# Patient Record
Sex: Female | Born: 1980 | Race: Black or African American | Hispanic: No | Marital: Married | Smoking: Never smoker
Health system: Southern US, Community
[De-identification: ages and names within clinical notes are randomized; demographics above are authoritative.]

---

## 2016-05-07 HISTORY — PX: TOOTH EXTRACTION: SUR596

## 2016-11-10 DIAGNOSIS — Z6841 Body Mass Index (BMI) 40.0 and over, adult: Secondary | ICD-10-CM

## 2016-11-10 DIAGNOSIS — J302 Other seasonal allergic rhinitis: Secondary | ICD-10-CM | POA: Insufficient documentation

## 2018-04-06 ENCOUNTER — Ambulatory Visit (INDEPENDENT_AMBULATORY_CARE_PROVIDER_SITE_OTHER): Payer: 59 | Admitting: Family Medicine

## 2018-04-06 ENCOUNTER — Ambulatory Visit (INDEPENDENT_AMBULATORY_CARE_PROVIDER_SITE_OTHER): Payer: Self-pay

## 2018-04-06 ENCOUNTER — Encounter (INDEPENDENT_AMBULATORY_CARE_PROVIDER_SITE_OTHER): Payer: Self-pay | Admitting: Family Medicine

## 2018-04-06 DIAGNOSIS — J3489 Other specified disorders of nose and nasal sinuses: Secondary | ICD-10-CM | POA: Diagnosis not present

## 2018-04-06 DIAGNOSIS — S060X0A Concussion without loss of consciousness, initial encounter: Secondary | ICD-10-CM

## 2018-04-06 MED ORDER — TRAMADOL HCL 50 MG PO TABS: 50.0000 mg | ORAL_TABLET | Freq: Four times a day (QID) | ORAL | 0 refills | Status: DC | PRN

## 2018-04-06 NOTE — Progress Notes (Signed)
   Office Visit Note   Patient: Tanya Fitzpatrick           Date of Birth: 06-07-1980           MRN: [id number] Visit Date: 04/06/2018 Requested by: No referring provider defined for this encounter. PCP: Devra Dopp, MD  Subjective: Chief Complaint  Patient presents with  . Swollen nasal area - head vs nose impact  . DOI 04/03/18    HPI: She is here with nasal pain.  On November 17, her 19-month-old child accidentally lifted its head hard into her nose.  Severe pain, she "saw stars".  She has had a headache and dizziness since then.  She had a nosebleed initially which was easy to control.  She is able to breathe out of both nostrils but she has a lot of pain and swelling on the bridge of her nose.  No previous problems with her nose.  No history of concussion.                ROS: She is otherwise in good health.  Objective: Vital Signs: There were no vitals taken for this visit.  Physical Exam:  HEENT: She has swelling and slight asymmetry at the bridge of her nose.  Very tender on the right side.  Nasal passages are patent, no evidence of bleeding.  No other tenderness, funduscopic exam is normal and pupils are equal and reactive to light and accommodation.  Imaging: X-rays nasal bones: Bilateral nondisplaced nasal bone fractures.    Assessment & Plan: 1.  3 days status post head contusion with bilateral nasal bone fractures and probable concussion -Ice applications to reduce swelling.  Anticipate 4 to 6 weeks healing time.  If she has another nasal trauma we will repeat x-rays.  Otherwise we will go by clinical healing. -From a concussion standpoint she will stay out of work until next week.  Minimize screen time.  Head CT if develops worsening symptoms.   Follow-Up Instructions: No follow-ups on file.      Procedures: No procedures performed  No notes on file    PMFS History: Patient Active Problem List   Diagnosis Date Noted  . Class 3 severe obesity due to  excess calories without serious comorbidity with body mass index (BMI) of 45.0 to 49.9 in adult (HCC) 11/10/2016  . Seasonal allergies 11/10/2016   History reviewed. No pertinent past medical history.  History reviewed. No pertinent family history.  History reviewed. No pertinent surgical history. Social History   Occupational History  . Not on file  Tobacco Use  . Smoking status: Not on file  Substance and Sexual Activity  . Alcohol use: Not on file  . Drug use: Not on file  . Sexual activity: Not on file

## 2018-05-09 ENCOUNTER — Other Ambulatory Visit (INDEPENDENT_AMBULATORY_CARE_PROVIDER_SITE_OTHER): Payer: Self-pay | Admitting: Family Medicine

## 2018-08-05 ENCOUNTER — Telehealth (INDEPENDENT_AMBULATORY_CARE_PROVIDER_SITE_OTHER): Payer: Self-pay

## 2018-08-05 NOTE — Telephone Encounter (Signed)
I called the patient, asking her covid-19 screening questions prior to her appointment with Dr. Prince Rome on 08/08/2018. She answered "no" to all.

## 2018-08-08 ENCOUNTER — Other Ambulatory Visit: Payer: Self-pay

## 2018-08-08 ENCOUNTER — Encounter (INDEPENDENT_AMBULATORY_CARE_PROVIDER_SITE_OTHER): Payer: Self-pay | Admitting: Family Medicine

## 2018-08-08 ENCOUNTER — Ambulatory Visit (INDEPENDENT_AMBULATORY_CARE_PROVIDER_SITE_OTHER): Payer: 59 | Admitting: Family Medicine

## 2018-08-08 DIAGNOSIS — R200 Anesthesia of skin: Secondary | ICD-10-CM

## 2018-08-08 NOTE — Progress Notes (Signed)
Office Visit Note   Patient: Tanya Fitzpatrick           Date of Birth: May 30, 1980           MRN: [id number] Visit Date: 08/08/2018 Requested by: Devra Dopp, MD [address] [place], [place] [address] PCP: Devra Dopp, MD  Subjective: Chief Complaint  Patient presents with  . Numbness in the right side of torso x 9 days    HPI: She is here with right arm and torso numbness.  Symptoms started about 8 or 9 days ago, she first noticed numbness in her right hand in the median nerve distribution.  The numbness seemed to spread up her arm and it did not go away.  She then noticed numbness on the right side of her neck, and then on the right side of her thoracic area and torso including her right breast.  Denies any pain with this.  She has dropped some objects with her right hand mainly because of the numbness, not definitely because of any weakness.  She had a headache 3 days ago but nothing severe.  She has had more headaches than usual since her head trauma several months ago.  Denies any blurry vision, double vision, anosmia, change in taste.  No fevers or chills.              ROS:  All other systems were reviewed and are negative.  Objective: Vital Signs: There were no vitals taken for this visit.  Physical Exam:  General:  Alert and oriented, in no acute distress. Pulm:  Breathing unlabored. Psy:  Normal mood, congruent affect. Skin: No rash on her skin. HEENT:  [place]/AT, PERRLA, EOM Full, no nystagmus.  Funduscopic examination within normal limits.  No conjunctival erythema.  Tympanic membranes are pearly gray with normal landmarks.  External ear canals are normal.  Nasal passages are clear.  Oropharynx is clear.  No significant lymphadenopathy.  No thyromegaly or nodules.  2+ carotid pulses without bruits. Cranial nerves II through XII are intact.  Upper extremity strength and reflexes are normal. Right arm: Negative Tinel's at the carpal tunnel but positive  Phalen's test increasing the numbness in her right hand and arm. Other neuro: Light touch sensation is diminished on the right side of her back throughout.  Imaging: None today.  Assessment & Plan: 1.  Right arm and side numbness, etiology uncertain.  Tunnel syndrome could explain her arm symptoms but not her torso symptoms.  Brain injury would be a consideration, less likely would be spinal cord lesion. -We will try a carpal tunnel night splint. -We will go ahead and order brain MRI scan.  If symptoms persist and MRI is unrevealing, then neurology consult.     Procedures: No procedures performed  No notes on file     PMFS History: Patient Active Problem List   Diagnosis Date Noted  . Class 3 severe obesity due to excess calories without serious comorbidity with body mass index (BMI) of 45.0 to 49.9 in adult (HCC) 11/10/2016  . Seasonal allergies 11/10/2016   History reviewed. No pertinent past medical history.  History reviewed. No pertinent family history.  History reviewed. No pertinent surgical history. Social History   Occupational History  . Not on file  Tobacco Use  . Smoking status: Not on file  Substance and Sexual Activity  . Alcohol use: Not on file  . Drug use: Not on file  . Sexual activity: Not on file

## 2018-08-11 ENCOUNTER — Telehealth (INDEPENDENT_AMBULATORY_CARE_PROVIDER_SITE_OTHER): Payer: Self-pay | Admitting: Family Medicine

## 2018-08-11 MED ORDER — TIZANIDINE HCL 2 MG PO TABS: 2.0000 mg | ORAL_TABLET | Freq: Four times a day (QID) | ORAL | 1 refills | Status: DC | PRN

## 2018-08-11 NOTE — Telephone Encounter (Signed)
I called and advised the patient. 

## 2018-08-11 NOTE — Telephone Encounter (Signed)
Rx sent 

## 2018-08-11 NOTE — Telephone Encounter (Signed)
Patient called and stated needs something for muscle spasms.  Please call patient to advise.  [phone]

## 2018-08-11 NOTE — Telephone Encounter (Signed)
Please advise 

## 2018-10-04 ENCOUNTER — Other Ambulatory Visit: Payer: Self-pay

## 2018-10-17 ENCOUNTER — Other Ambulatory Visit: Payer: 59

## 2019-05-24 ENCOUNTER — Ambulatory Visit
Admission: RE | Admit: 2019-05-24 | Discharge: 2019-05-24 | Disposition: A | Discharge status: Home / Self Care | Payer: 59 | Source: Ambulatory Visit | Attending: Family Medicine | Admitting: Family Medicine

## 2019-05-24 ENCOUNTER — Other Ambulatory Visit: Payer: Self-pay

## 2019-05-24 ENCOUNTER — Other Ambulatory Visit: Payer: 59

## 2019-05-24 DIAGNOSIS — R200 Anesthesia of skin: Secondary | ICD-10-CM

## 2019-05-25 ENCOUNTER — Encounter (INDEPENDENT_AMBULATORY_CARE_PROVIDER_SITE_OTHER): Payer: Self-pay | Admitting: Family Medicine

## 2019-05-25 ENCOUNTER — Telehealth: Payer: Self-pay | Admitting: Family Medicine

## 2019-05-25 DIAGNOSIS — G35 Multiple sclerosis: Secondary | ICD-10-CM

## 2019-05-25 NOTE — Telephone Encounter (Signed)
Brain MRI scan shows multiple bilateral supratentorial white matter lesions concerning for multiple sclerosis.  I will discuss these findings with the patient and make referral to neurologist.

## 2019-06-21 ENCOUNTER — Ambulatory Visit (INDEPENDENT_AMBULATORY_CARE_PROVIDER_SITE_OTHER): Payer: No Typology Code available for payment source | Admitting: Neurology

## 2019-06-21 ENCOUNTER — Other Ambulatory Visit: Payer: Self-pay

## 2019-06-21 ENCOUNTER — Telehealth: Payer: Self-pay | Admitting: *Deleted

## 2019-06-21 ENCOUNTER — Encounter: Payer: Self-pay | Admitting: Neurology

## 2019-06-21 VITALS — BP 118/78 | HR 76 | Temp 97.1°F | Ht 67.0 in | Wt 279.4 lb

## 2019-06-21 DIAGNOSIS — G35 Multiple sclerosis: Secondary | ICD-10-CM

## 2019-06-21 DIAGNOSIS — R269 Unspecified abnormalities of gait and mobility: Secondary | ICD-10-CM | POA: Insufficient documentation

## 2019-06-21 DIAGNOSIS — Z79899 Other long term (current) drug therapy: Secondary | ICD-10-CM

## 2019-06-21 DIAGNOSIS — R208 Other disturbances of skin sensation: Secondary | ICD-10-CM

## 2019-06-21 MED ORDER — GABAPENTIN 300 MG PO CAPS: 300.0000 mg | ORAL_CAPSULE | Freq: Three times a day (TID) | ORAL | 11 refills | Status: DC

## 2019-06-21 NOTE — Progress Notes (Signed)
[facility]  PATIENT: Tanya Fitzpatrick DOB: 14-Mar-1981  REFERRING DOCTOR OR PCP: Abran Duke, MD SOURCE: Patient, notes from primary care, imaging and lab reports, MRI images personally reviewed.  _________________________________   HISTORICAL  CHIEF COMPLAINT:  Chief Complaint  Patient presents with  . New Patient (Initial Visit)    RM 13 with spouse, Christa (temp: 97.6). Internal referral for MS. MRI brain completed 05/24/19 and can be viewed in epic. Findings suggested MS. Mothers niece and grandmothers sister daughter on moms side have MS.    HISTORY OF PRESENT ILLNESS:  I had the pleasure of seeing your patient, Tanya Fitzpatrick, at the MS center at [facility] for neurologic consultation regarding her abnormal brain MRI worrisome for multiple sclerosis.  She is a 39 year old woman who has a recent brain MRI worrisome for MS.   In November 2019, her niece accidentally broke her nose and she had a headache.  She was told she had a concussion.   She has had frequent headaches since that time.    In February 2020, she had numbness in the chest bilaterally and in the right arm.   Over the next month, the numbness resolved in the chest but continued in the right arm.  She reports that an MRI has been scheduled but due to COVID-19 and changes in schedules, she never did the study.  She has had continued pain in her chest, arm and neck the past year.     She had Covid-19 in mid November and she noted more headaches but did not require hospitalization.  She also noted numbness in the left hand that started in early December.    She notes it mostly in the shower and it is only a little better than when she first had the symptoms.    She feels her balance is slightly worse now than a couple years ago but no stumbles or falls.  The right arm is a little weak.    Gripping her computer mouse is sometimes more difficult.  She can easily walk a mile.  She  notes vision is a little more blurry, right worse than left.  She notes she is urinating a lot more frequently over the past year.     She notes more fatigue the past year than previously.    She sleeps poorly some nights.   She is more irritable and has had some anxiety since th chest symptoms started.     She felt her cognition seemed a little worse after the concussion and after Covid.    She is a little more forgetful.     I personally reviewed the MRI of the brain.  It was done without contrast.  There are multiple T2/FLAIR hyperintense foci in the periventricular, juxtacortical and deep white matter.  Additionally there are some foci in the cerebellum and brainstem.  The pattern is very consistent with multiple sclerosis.  Some foci are hyperintense on diffusion-weighted images but it is uncertain if these represent more acute lesions or shine through.  She has one first cousin and one second cousin with MS.  One diagnosed in early 39's and one around age 39.     REVIEW OF SYSTEMS: Constitutional: No fevers, chills, sweats, or change in appetite.  She notes fatigue.  She has some insomnia at times. Eyes: No visual changes, double vision, eye pain Ear, nose and throat: No hearing loss, ear pain, nasal congestion, sore throat Cardiovascular: No chest pain, palpitations  Respiratory: No shortness of breath at rest or with exertion.   No wheezes GastrointestinaI: No nausea, vomiting, diarrhea, abdominal pain, fecal incontinence Genitourinary:She has urinary frequency.. Musculoskeletal:As above. Integumentary: No rash, pruritus, skin lesions Neurological: as above Psychiatric: No depression at this time.  No anxiety Endocrine: No palpitations, diaphoresis, change in appetite, change in weigh or increased thirst Hematologic/Lymphatic: No anemia, purpura, petechiae. Allergic/Immunologic: No itchy/runny eyes, nasal congestion, recent allergic reactions, rashes  ALLERGIES: No Known  Allergies  HOME MEDICATIONS:  Current Outpatient Medications:  .  Acetaminophen (ARTHRITIS PAIN PO), Take 2-4 tablets by mouth daily., Disp: , Rfl:  .  albuterol (PROVENTIL HFA;VENTOLIN HFA) 108 (90 Base) MCG/ACT inhaler, Inhale into the lungs., Disp: , Rfl:  .  fluticasone (FLONASE) 50 MCG/ACT nasal spray, SPRAY 2 SPRAYS INTO BOTH NOSTRILS DAILY, Disp: , Rfl:  .  Loratadine (CLARITIN REDITABS PO), Take by mouth., Disp: , Rfl:  .  phentermine 37.5 MG capsule, Take by mouth., Disp: , Rfl:  .  tiZANidine (ZANAFLEX) 2 MG tablet, Take 1-2 tablets (2-4 mg total) by mouth every 6 (six) hours as needed for muscle spasms., Disp: 60 tablet, Rfl: 1 .  gabapentin (NEURONTIN) 300 MG capsule, Take 1 capsule (300 mg total) by mouth 3 (three) times daily., Disp: 90 capsule, Rfl: 11  PAST MEDICAL HISTORY: History reviewed. No pertinent past medical history.  PAST SURGICAL HISTORY: Past Surgical History:  Procedure Laterality Date  . TOOTH EXTRACTION  05/07/2016    FAMILY HISTORY: History reviewed. No pertinent family history.  SOCIAL HISTORY:  Social History   Socioeconomic History  . Marital status: Married    Spouse name: Not on file  . Number of children: Not on file  . Years of education: Not on file  . Highest education level: Not on file  Occupational History  . Occupation: [employer]-works from home  Tobacco Use  . Smoking status: Never Smoker  . Smokeless tobacco: Never Used  Substance and Sexual Activity  . Alcohol use: Never  . Drug use: Never  . Sexual activity: Not on file  Other Topics Concern  . Not on file  Social History Narrative   Lives with spouse and stepdaughters.   Caffeine use:    Starbucks frap 1-2 per week   Right handed    Social Determinants of Health   Financial Resource Strain:   . Difficulty of Paying Living Expenses: Not on file  Food Insecurity:   . Worried About Programme researcher, broadcasting/film/video in the Last Year: Not on file  . Ran Out of Food in  the Last Year: Not on file  Transportation Needs:   . Lack of Transportation (Medical): Not on file  . Lack of Transportation (Non-Medical): Not on file  Physical Activity:   . Days of Exercise per Week: Not on file  . Minutes of Exercise per Session: Not on file  Stress:   . Feeling of Stress : Not on file  Social Connections:   . Frequency of Communication with Friends and Family: Not on file  . Frequency of Social Gatherings with Friends and Family: Not on file  . Attends Religious Services: Not on file  . Active Member of Clubs or Organizations: Not on file  . Attends Banker Meetings: Not on file  . Marital Status: Not on file  Intimate Partner Violence:   . Fear of Current or Ex-Partner: Not on file  . Emotionally Abused: Not on file  . Physically Abused: Not on file  .  Sexually Abused: Not on file     PHYSICAL EXAM  Vitals:   06/21/19 1327  BP: 118/78  Pulse: 76  Temp: (!) 97.1 F (36.2 C)  Weight: 279 lb 6.4 oz (126.7 kg)  Height: 5\' 7"  (1.702 m)    Body mass index is 43.76 kg/m.   General: The patient is well-developed and well-nourished and in no acute distress  HEENT:  Head is [place]/AT.  Sclera are anicteric.  Funduscopic exam shows normal optic discs and retinal vessels.  Neck: No carotid bruits are noted.  The neck is nontender.  Cardiovascular: The heart has a regular rate and rhythm with a normal S1 and S2. There were no murmurs, gallops or rubs.    Skin: Extremities are without rash or  edema.  Musculoskeletal:  Back is nontender  Neurologic Exam  Mental status: The patient is alert and oriented x 3 at the time of the examination. The patient has apparent normal recent and remote memory, with an apparently normal attention span and concentration ability.   Speech is normal.  Cranial nerves: Extraocular movements are full. Pupils are equal, round, and reactive to light and accomodation.  Reduced color vision OS.    Facial symmetry is  present. There is good facial sensation to soft touch bilaterally.Facial strength is normal.  Trapezius and sternocleidomastoid strength is normal. No dysarthria is noted.  The tongue is midline, and the patient has symmetric elevation of the soft palate. No obvious hearing deficits are noted.  Motor:  Muscle bulk is normal.   Tone is normal. Strength is  5 / 5 in all 4 extremities.   Sensory: Reduced touch and altered temperature sensation on the right, worse in the arm but to some extent in the neck, flank and to a lesser extent in the leg..  Reduced vibration on the right, arm more asymmetric than leg.  Coordination: Cerebellar testing reveals good finger-nose-finger and heel-to-shin bilaterally.  Gait and station: Station is normal.   Gait is normal. Tandem gait is slightly wide. Romberg is negative.   Reflexes: Deep tendon reflexes are symmetric and normal bilaterally.   Plantar responses are flexor.      ASSESSMENT AND PLAN  Multiple sclerosis (HCC) - Plan: Hepatitis B core antibody, total, Hepatitis B surface antigen, HIV Antibody (routine testing w rflx), Neuromyelitis optica autoab, IgG, Varicella zoster antibody, IgG, Hepatitis B surface antibody,qualitative, Comprehensive metabolic panel, CBC with Differential/Platelet, Other/Misc lab test, Stratify [facility] Antibody Test ([facility])  High risk medication use - Plan: Hepatitis B core antibody, total, Hepatitis B surface antigen, HIV Antibody (routine testing w rflx), Neuromyelitis optica autoab, IgG, Varicella zoster antibody, IgG, Hepatitis B surface antibody,qualitative, Comprehensive metabolic panel, CBC with Differential/Platelet, Other/Misc lab test, Stratify JCV Antibody Test ([facility])  Dysesthesia  Gait disturbance   In summary, Ms. Midkiff is a 39 year old woman who presents with several neurologic symptoms occurring over the last year and an abnormal brain MRI consistent with MS.  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  I discussed with her and her spouse that we will not need to do additional testing such as a lumbar puncture.  In detail, I discussed several different disease modifying therapies.  She is most interested in one of the oral agents and I discussed Tecfidera/Vumerity and Mayzent/Zeposia in more detail.  We will check blood work for these and rule out chronic infections that might  limit the use of some medications.  We will also consider checking MRI of the spine as some of her symptoms are more likely due to a spinal cord plaque than a brain lesion.  To help with the dysesthetic sensations I have added gabapentin.  She is advised to exercise and stay active.  We discussed healthy diet and vitamin D supplementation.  She will return to see [place] in 3 to 4 months or sooner if there are new or worsening neurologic symptoms or based on the laboratory findings.  Thank you for asking me to see Ms. Mariner.  Please let me know if I can be of further assistance with her or other patients in the future.   Ilario Dhaliwal A. Felecia Shelling, MD, [facility] 12/19/938, 7:68 PM Certified in Neurology, Clinical Neurophysiology, Sleep Medicine and Neuroimaging  [facility] [address], [place], [place] [address] [phone]

## 2019-06-21 NOTE — Telephone Encounter (Signed)
Placed [facility] lab in [facility] lock box for routine lab pick up. Results pending. 

## 2019-06-27 ENCOUNTER — Telehealth: Payer: Self-pay | Admitting: *Deleted

## 2019-06-27 LAB — COMPREHENSIVE METABOLIC PANEL WITH GFR
ALT: 16 IU/L (ref 0–32)
AST: 20 IU/L (ref 0–40)
Albumin/Globulin Ratio: 1.4 (ref 1.2–2.2)
Albumin: 4.2 g/dL (ref 3.8–4.8)
Alkaline Phosphatase: 64 IU/L (ref 39–117)
BUN/Creatinine Ratio: 12 (ref 9–23)
BUN: 8 mg/dL (ref 6–20)
Bilirubin Total: 0.4 mg/dL (ref 0.0–1.2)
CO2: 24 mmol/L (ref 20–29)
Calcium: 9.6 mg/dL (ref 8.7–10.2)
Chloride: 104 mmol/L (ref 96–106)
Creatinine, Ser: 0.66 mg/dL (ref 0.57–1.00)
GFR calc Af Amer: 130 mL/min/1.73 (ref >59)
GFR calc non Af Amer: 112 mL/min/1.73 (ref >59)
Globulin, Total: 3 g/dL (ref 1.5–4.5)
Glucose: 74 mg/dL (ref 65–99)
Potassium: 5 mmol/L (ref 3.5–5.2)
Sodium: 141 mmol/L (ref 134–144)
Total Protein: 7.2 g/dL (ref 6.0–8.5)

## 2019-06-27 LAB — CBC WITH DIFFERENTIAL/PLATELET
Basophils Absolute: 0 10*3/uL (ref 0.0–0.2)
Basos: 0 %
EOS (ABSOLUTE): 0.1 10*3/uL (ref 0.0–0.4)
Eos: 1 %
Hematocrit: 33.7 % — ABNORMAL LOW (ref 34.0–46.6)
Hemoglobin: 10.9 g/dL — ABNORMAL LOW (ref 11.1–15.9)
Immature Grans (Abs): 0 10*3/uL (ref 0.0–0.1)
Immature Granulocytes: 0 %
Lymphocytes Absolute: 2.5 10*3/uL (ref 0.7–3.1)
Lymphs: 39 %
MCH: 25.5 pg — ABNORMAL LOW (ref 26.6–33.0)
MCHC: 32.3 g/dL (ref 31.5–35.7)
MCV: 79 fL (ref 79–97)
Monocytes Absolute: 0.5 10*3/uL (ref 0.1–0.9)
Monocytes: 7 %
Neutrophils Absolute: 3.4 10*3/uL (ref 1.4–7.0)
Neutrophils: 53 %
Platelets: 409 10*3/uL (ref 150–450)
RBC: 4.28 x10E6/uL (ref 3.77–5.28)
RDW: 16.4 % — ABNORMAL HIGH (ref 11.7–15.4)
WBC: 6.5 10*3/uL (ref 3.4–10.8)

## 2019-06-27 LAB — VARICELLA ZOSTER ANTIBODY, IGG: Varicella zoster IgG: 731 {index} (ref >165)

## 2019-06-27 LAB — HEPATITIS B CORE ANTIBODY, TOTAL: Hep B Core Total Ab: NEGATIVE

## 2019-06-27 LAB — NEUROMYELITIS OPTICA AUTOAB, IGG: NMO IgG Autoantibodies: <1.5 U/mL (ref 0.0–3.0)

## 2019-06-27 LAB — CYP2C9 GENOTYPING SIPONIMOD: PDF: 0

## 2019-06-27 LAB — HEPATITIS B SURFACE ANTIGEN: Hepatitis B Surface Ag: NEGATIVE

## 2019-06-27 LAB — HIV ANTIBODY (ROUTINE TESTING W REFLEX): HIV Screen 4th Generation wRfx: NONREACTIVE

## 2019-06-27 LAB — HEPATITIS B SURFACE ANTIBODY,QUALITATIVE: Hep B Surface Ab, Qual: NONREACTIVE

## 2019-06-27 NOTE — Telephone Encounter (Signed)
-----   Message from Asa Lente, MD sent at 06/26/2019  5:16 PM EST ----- Please let her know that the lab work looked good and she can go on any of the pills that we discussed.  Has she had time to think about this further?  (The 2 C9 genotype is still pending we had discussed Tecfidera/Vumerity or Mayzent/Zeposia and she was going to give this further thought)

## 2019-06-28 NOTE — Telephone Encounter (Signed)
JCV ab drawn on 06/21/19 positive, index: 0.71

## 2019-06-28 NOTE — Telephone Encounter (Addendum)
PA submitted on CMM for Bafiertam 95MG  dr capsules: [place] - PA Case IDJosepha Pigg. PA approved. Request Reference Number: : [id number]. BAFIERTAM CAP 95MG  is approved through 06/26/2020. Your patient may now fill this prescription and it will be covered.  Start form faxed to at [phone]. Received fax confirmation.

## 2019-07-10 NOTE — Telephone Encounter (Signed)
Took call from phone room and spoke with pt. Sx started today. If she tries to step on left leg, feels like it is going to break. Has been taking gabapentin one in the am and 2 in the pm. Has been helping until today. She will start on Bafiertam on 07/12/19. Should arrive in mail that day. She ordered last week. Spoke with MD who would like her to come in for appt. Scheduled f/u for 07/11/19 at 2:00pm check in 1:30pm. Asked that she wear mask to appt.

## 2019-07-11 ENCOUNTER — Ambulatory Visit: Payer: Self-pay | Admitting: Neurology

## 2019-07-11 NOTE — Telephone Encounter (Signed)
Called, LVM for pt letting her know she does not need to come for appt if symptoms have improved. Wanted to see how she was feeling. Asked her to call back to let us know if she is going to keep appt this afternoon or not.

## 2019-07-31 ENCOUNTER — Other Ambulatory Visit: Payer: Self-pay | Admitting: Neurology

## 2019-07-31 MED ORDER — TRAMADOL HCL 50 MG PO TABS: 50.0000 mg | ORAL_TABLET | Freq: Every day | ORAL | 1 refills | Status: DC | PRN

## 2019-09-26 ENCOUNTER — Other Ambulatory Visit: Payer: Self-pay

## 2019-09-26 ENCOUNTER — Encounter: Payer: Self-pay | Admitting: Neurology

## 2019-09-26 ENCOUNTER — Ambulatory Visit (INDEPENDENT_AMBULATORY_CARE_PROVIDER_SITE_OTHER): Payer: No Typology Code available for payment source | Admitting: Neurology

## 2019-09-26 VITALS — BP 134/90 | HR 72 | Temp 97.7°F | Ht 67.0 in | Wt 284.0 lb

## 2019-09-26 DIAGNOSIS — R269 Unspecified abnormalities of gait and mobility: Secondary | ICD-10-CM

## 2019-09-26 DIAGNOSIS — R208 Other disturbances of skin sensation: Secondary | ICD-10-CM

## 2019-09-26 DIAGNOSIS — Z79899 Other long term (current) drug therapy: Secondary | ICD-10-CM | POA: Diagnosis not present

## 2019-09-26 DIAGNOSIS — G35 Multiple sclerosis: Secondary | ICD-10-CM

## 2019-09-26 MED ORDER — TRAMADOL HCL 50 MG PO TABS: 50.0000 mg | ORAL_TABLET | Freq: Two times a day (BID) | ORAL | 5 refills | Status: DC | PRN

## 2019-09-26 NOTE — Progress Notes (Signed)
[facility]  PATIENT: Tanya Fitzpatrick DOB: 1981/02/16  REFERRING DOCTOR OR PCP: Abran Duke, MD SOURCE: Patient, notes from primary care, imaging and lab reports, MRI images personally reviewed.  _________________________________   HISTORICAL  CHIEF COMPLAINT:  Chief Complaint  Patient presents with  . Follow-up    RM 12. Last seen 06/21/2019. She is taking tramadol instead of gabapentin. Felt gabapentin did not help pain but she is having to take more than 1 tramadol per day. wants to discuss this.  . Multiple Sclerosis    On Bafiertam.Has had an increase in headaches since taking this. Eyelashes/eyebrows falling out more. Having more mood swings. Also having hot flashes (she has had this in the past). She is also having more difficulty with swallowing.   . Form Completion    She brought forms that needed to be filled out.    HISTORY OF PRESENT ILLNESS:  Tanya Fitzpatrick is a 39 year old woman with relapsing remitting multiple sclerosis.  Update 09/26/2019: She is on Bafiertam (MMF) as a DMT for her MS.   She notes some headaches and also notes her scalp hurts more.      She feels balance is slightly off but she is generally doing well.  She notes this more on uneven surface or grass.   She still notes numbness in the right arm.   She finds some clumsiness with the right arm.   She notes some numbness in the left arm but only after her shower (warm nit hot water).   She feels the right hand has a reduced grip.   She gets dysesthesias in her right arm and chest.    She take sone a day but notes benefit only for a few hours.   She takes gabapentin 600 mg po qHS as daytime doses made her sleepy.   Tramadol helps her pain some.    She sleeps well with these med's.    She has fatigue.   Phentermine helps her fatigue some.  She feels focus and attention are reduced.    She has insomnia.  She does not snore.  She notes being moody and more irritable.   She has a  short fuse.       From 06/21/2019: She is a 39 year old woman who has a recent brain MRI worrisome for MS.   In November 2019, her niece accidentally broke her nose and she had a headache.  She was told she had a concussion.   She has had frequent headaches since that time.    In February 2020, she had numbness in the chest bilaterally and in the right arm.   Over the next month, the numbness resolved in the chest but continued in the right arm.  She reports that an MRI has been scheduled but due to COVID-19 and changes in schedules, she never did the study.  She has had continued pain in her chest, arm and neck the past year.     She had Covid-19 in mid November and she noted more headaches but did not require hospitalization.  She also noted numbness in the left hand that started in early December.    She notes it mostly in the shower and it is only a little better than when she first had the symptoms.    She feels her balance is slightly worse now than a couple years ago but no stumbles or falls.  The right arm is a little weak.    Gripping her  computer mouse is sometimes more difficult.  She can easily walk a mile.  She notes vision is a little more blurry, right worse than left.  She notes she is urinating a lot more frequently over the past year.     She notes more fatigue the past year than previously.    She sleeps poorly some nights.   She is more irritable and has had some anxiety since th chest symptoms started.     She felt her cognition seemed a little worse after the concussion and after Covid.    She is a little more forgetful.     I personally reviewed the MRI of the brain.  It was done without contrast.  There are multiple T2/FLAIR hyperintense foci in the periventricular, juxtacortical and deep white matter.  Additionally there are some foci in the cerebellum and brainstem.  The pattern is very consistent with multiple sclerosis.  Some foci are hyperintense on diffusion-weighted images but  it is uncertain if these represent more acute lesions or shine through.  She has one first cousin and one second cousin with MS.  One diagnosed in early 27's and one around age 19.     REVIEW OF SYSTEMS: Constitutional: No fevers, chills, sweats, or change in appetite.  She notes fatigue.  She has some insomnia at times. Eyes: No visual changes, double vision, eye pain Ear, nose and throat: No hearing loss, ear pain, nasal congestion, sore throat Cardiovascular: No chest pain, palpitations Respiratory: No shortness of breath at rest or with exertion.   No wheezes GastrointestinaI: No nausea, vomiting, diarrhea, abdominal pain, fecal incontinence Genitourinary:She has urinary frequency.. Musculoskeletal:As above. Integumentary: No rash, pruritus, skin lesions Neurological: as above Psychiatric: No depression at this time.  No anxiety Endocrine: No palpitations, diaphoresis, change in appetite, change in weigh or increased thirst Hematologic/Lymphatic: No anemia, purpura, petechiae. Allergic/Immunologic: No itchy/runny eyes, nasal congestion, recent allergic reactions, rashes  ALLERGIES: No Known Allergies  HOME MEDICATIONS:  Current Outpatient Medications:  .  Acetaminophen (ARTHRITIS PAIN PO), Take 2-4 tablets by mouth daily., Disp: , Rfl:  .  albuterol (PROVENTIL HFA;VENTOLIN HFA) 108 (90 Base) MCG/ACT inhaler, Inhale into the lungs., Disp: , Rfl:  .  fluticasone (FLONASE) 50 MCG/ACT nasal spray, SPRAY 2 SPRAYS INTO BOTH NOSTRILS DAILY, Disp: , Rfl:  .  Loratadine (CLARITIN REDITABS PO), Take by mouth., Disp: , Rfl:  .  Monomethyl Fumarate (BAFIERTAM) 95 MG CPDR, Take by mouth. Titration: 95mg  po BIDx7 days then 190mg  (2 caps po BID) thereafter for maintenance dose, Disp: , Rfl:  .  phentermine 37.5 MG capsule, Take by mouth., Disp: , Rfl:  .  traMADol (ULTRAM) 50 MG tablet, Take 1 tablet (50 mg total) by mouth every 12 (twelve) hours as needed., Disp: 60 tablet, Rfl: 5  PAST  MEDICAL HISTORY: No past medical history on file.  PAST SURGICAL HISTORY: Past Surgical History:  Procedure Laterality Date  . TOOTH EXTRACTION  05/07/2016    FAMILY HISTORY: No family history on file.  SOCIAL HISTORY:  Social History   Socioeconomic History  . Marital status: Married    Spouse name: Not on file  . Number of children: Not on file  . Years of education: Not on file  . Highest education level: Not on file  Occupational History  . Occupation: [employer]-works from home  Tobacco Use  . Smoking status: Never Smoker  . Smokeless tobacco: Never Used  Substance and Sexual Activity  . Alcohol use: Never  .  Drug use: Never  . Sexual activity: Not on file  Other Topics Concern  . Not on file  Social History Narrative   Lives with spouse and stepdaughters.   Caffeine use:    Starbucks frap 1-2 per week   Right handed    Social Determinants of Health   Financial Resource Strain:   . Difficulty of Paying Living Expenses:   Food Insecurity:   . Worried About Charity fundraiser in the Last Year:   . Arboriculturist in the Last Year:   Transportation Needs:   . Film/video editor (Medical):   Marland Kitchen Lack of Transportation (Non-Medical):   Physical Activity:   . Days of Exercise per Week:   . Minutes of Exercise per Session:   Stress:   . Feeling of Stress :   Social Connections:   . Frequency of Communication with Friends and Family:   . Frequency of Social Gatherings with Friends and Family:   . Attends Religious Services:   . Active Member of Clubs or Organizations:   . Attends Archivist Meetings:   Marland Kitchen Marital Status:   Intimate Partner Violence:   . Fear of Current or Ex-Partner:   . Emotionally Abused:   Marland Kitchen Physically Abused:   . Sexually Abused:      PHYSICAL EXAM  Vitals:   09/26/19 1555  BP: 134/90  Pulse: 72  Temp: 97.7 F (36.5 C)  Weight: 284 lb (128.8 kg)  Height: 5\' 7"  (1.702 m)    Body mass index is 44.48  kg/m.   General: The patient is well-developed and well-nourished and in no acute distress  HEENT:  Head is [place]/AT.  Sclera are anicteric.   Neurologic Exam  Mental status: The patient is alert and oriented x 3 at the time of the examination. The patient has apparent normal recent and remote memory, with an apparently normal attention span and concentration ability.   Speech is normal.  Cranial nerves: Extraocular movements are full.   Colors are symmetric.    Facial symmetry is present. There is good facial sensation to soft touch bilaterally.Facial strength is normal.  Trapezius and sternocleidomastoid strength is normal. No dysarthria is noted.   No obvious hearing deficits are noted.  Motor:  Muscle bulk is normal.   Tone is normal. Strength is  4+/5 in right arm and 5 / 5 elsewhere    Sensory: She has reduced touch/temperature in the right arm and flank but symmetric in legs.   She has reduced vibration sensation in the right arm, symmetric in legs.  Coordination: Cerebellar testing reveals good left but mildly reduced right finger-nose-finger and symmetric heel-to-shin .  Gait and station: Station is normal.   Gait is normal. Tandem gait is slightly wide. Romberg is negative.   Reflexes: Deep tendon reflexes are symmetric and normal bilaterally.   Plantar responses are flexor.      ASSESSMENT AND PLAN  Multiple sclerosis ([place]) - Plan: CBC with Differential/Platelet, Hepatic function panel  Dysesthesia  Gait disturbance  High risk medication use - Plan: CBC with Differential/Platelet, Hepatic function panel  1.   She will continue monitor methyl fumarate.  We will check some blood work today.  We discussed getting an MRI after she has been on therapy for 6 to 9 months to determine if she is having any subclinical progression.  If this is occurring, we may need to consider a different disease modifying therapy. 2.   Due to symptoms  in the right arm, she is noticing some  difficulties at work at times.  Hopefully her symptoms will improve some over time. 3.   She will increase the gabapentin to 300 mg in the afternoon and 600 mg at night in the hope that that helps her dysesthesias some.  Additionally I will increase the tramadol to twice a day. 4.   She will return to see me in 5 months or sooner if there are new or worsening neurologic symptoms.  Gaius Ishaq A. Epimenio Foot, MD, [facility] 09/26/2019, 6:12 PM Certified in Neurology, Clinical Neurophysiology, Sleep Medicine and Neuroimaging  [facility] [address], [address] [place], [place] [address] [phone]

## 2019-09-27 LAB — CBC WITH DIFFERENTIAL/PLATELET
Basophils Absolute: 0 10*3/uL (ref 0.0–0.2)
Basos: 0 %
EOS (ABSOLUTE): 0.1 10*3/uL (ref 0.0–0.4)
Eos: 2 %
Hematocrit: 37.8 % (ref 34.0–46.6)
Hemoglobin: 11.9 g/dL (ref 11.1–15.9)
Immature Grans (Abs): 0 10*3/uL (ref 0.0–0.1)
Immature Granulocytes: 0 %
Lymphocytes Absolute: 2.2 10*3/uL (ref 0.7–3.1)
Lymphs: 33 %
MCH: 25.6 pg — ABNORMAL LOW (ref 26.6–33.0)
MCHC: 31.5 g/dL (ref 31.5–35.7)
MCV: 82 fL (ref 79–97)
Monocytes Absolute: 0.6 10*3/uL (ref 0.1–0.9)
Monocytes: 9 %
Neutrophils Absolute: 3.9 10*3/uL (ref 1.4–7.0)
Neutrophils: 56 %
Platelets: 382 10*3/uL (ref 150–450)
RBC: 4.64 x10E6/uL (ref 3.77–5.28)
RDW: 13.7 % (ref 11.7–15.4)
WBC: 6.8 10*3/uL (ref 3.4–10.8)

## 2019-09-27 LAB — HEPATIC FUNCTION PANEL
ALT: 10 IU/L (ref 0–32)
AST: 14 IU/L (ref 0–40)
Albumin: 4.4 g/dL (ref 3.8–4.8)
Alkaline Phosphatase: 59 IU/L (ref 39–117)
Bilirubin Total: 0.5 mg/dL (ref 0.0–1.2)
Bilirubin, Direct: 0.16 mg/dL (ref 0.00–0.40)
Total Protein: 7.1 g/dL (ref 6.0–8.5)

## 2019-12-05 ENCOUNTER — Encounter: Payer: Self-pay | Admitting: *Deleted

## 2019-12-06 ENCOUNTER — Other Ambulatory Visit: Payer: Self-pay | Admitting: *Deleted

## 2019-12-06 MED ORDER — ZOLPIDEM TARTRATE 5 MG PO TABS: 5.0000 mg | ORAL_TABLET | Freq: Every evening | ORAL | 3 refills | Status: DC | PRN

## 2020-03-07 ENCOUNTER — Encounter: Payer: Self-pay | Admitting: Neurology

## 2020-03-07 ENCOUNTER — Ambulatory Visit (INDEPENDENT_AMBULATORY_CARE_PROVIDER_SITE_OTHER): Payer: No Typology Code available for payment source | Admitting: Neurology

## 2020-03-07 ENCOUNTER — Other Ambulatory Visit: Payer: Self-pay

## 2020-03-07 VITALS — BP 115/78 | HR 66 | Ht 67.0 in | Wt 294.0 lb

## 2020-03-07 DIAGNOSIS — Z79899 Other long term (current) drug therapy: Secondary | ICD-10-CM | POA: Diagnosis not present

## 2020-03-07 DIAGNOSIS — R269 Unspecified abnormalities of gait and mobility: Secondary | ICD-10-CM

## 2020-03-07 DIAGNOSIS — R208 Other disturbances of skin sensation: Secondary | ICD-10-CM

## 2020-03-07 DIAGNOSIS — E559 Vitamin D deficiency, unspecified: Secondary | ICD-10-CM

## 2020-03-07 DIAGNOSIS — G35 Multiple sclerosis: Secondary | ICD-10-CM | POA: Diagnosis not present

## 2020-03-07 MED ORDER — TRAMADOL HCL 50 MG PO TABS: 50.0000 mg | ORAL_TABLET | Freq: Two times a day (BID) | ORAL | 5 refills | Status: DC | PRN

## 2020-03-07 MED ORDER — CYCLOBENZAPRINE HCL 5 MG PO TABS: 5.0000 mg | ORAL_TABLET | Freq: Three times a day (TID) | ORAL | 0 refills | Status: DC | PRN

## 2020-03-07 MED ORDER — GABAPENTIN 300 MG PO CAPS: 300.0000 mg | ORAL_CAPSULE | Freq: Three times a day (TID) | ORAL | 11 refills | Status: DC

## 2020-03-07 MED ORDER — ZOLPIDEM TARTRATE 5 MG PO TABS: 5.0000 mg | ORAL_TABLET | Freq: Every evening | ORAL | 3 refills | Status: DC | PRN

## 2020-03-07 NOTE — Progress Notes (Signed)
[facility]  PATIENT: Tanya Fitzpatrick DOB: 02-28-1981  REFERRING DOCTOR OR PCP: Abran Duke, MD SOURCE: Patient, notes from primary care, imaging and lab reports, MRI images personally reviewed.  _________________________________   HISTORICAL  CHIEF COMPLAINT:  Chief Complaint  Patient presents with  . Follow-up    RM 12. Last seen 09/26/2019. Having left shoulder pain post exercise. Requesting muscle relaxer.  . Multiple Sclerosis    On Bafiertam. Right arm numbness have improved some. Right/left leg has intermittent muscle spasms.   . Dysesthesia    Takes gabapentin  . Insomnia    Takes ambien. Feels like she is sleeping pretty good.     HISTORY OF PRESENT ILLNESS:  Tanya Fitzpatrick is a 39 year old woman with relapsing remitting multiple sclerosis.  Update 03/07/2020: She is on Bafiertam (MMF) as a DMT for her MS.   She denies exacerbation.   Her right leg stiffens up at times now and left leg did once as well.   She feels balance is slightly off but she is generally doing well.  She notes this more on uneven surface or grass.   She still notes numbness in the right hand but arm is better.   She finds some clumsiness with the right arm.    She feels the right hand grip is back to normal.   She has urinary urgency but not incontinence.   Vision  Is about the same.   She will see ophthalmology in January.  She notes left neck/shoulder pain since Monday that started while exercising (lifting weights).   Current pain is more in the trapezius / lower cervical paraspinal region.    She has mild fatigue at times.   She stopped phentermine.  It did help weight and energy a bit. A frappucino usually gets her energized again.    She sleeps well most nights on Ambien 5 mg.   She works early am.    MS HISTORY: She was diagnosed with MS 05/2019.      In February 2020, she had numbness in the chest bilaterally and in the right arm.   Over the next month, the  numbness resolved in the chest but continued in the right arm.  She reports that an MRI had been scheduled but due to COVID-19 and changes in schedules, she never did the study.     She had Covid-19 in mid November 2020 and she noted more headaches but did not require hospitalization.  She noted numbness in the left hand that started in early December.  MRI 05/24/2019 was c/w MS and she was referred.  She was started on Bafiertam (MMF) February 2021.    IMAGING MRI of the brain (without contrast) 05/24/2019 shows multiple T2/FLAIR hyperintense foci in the periventricular, juxtacortical and deep white matter.  Additionally there are some foci in the cerebellum and brainstem.  The pattern is very consistent with multiple sclerosis.  Some foci are hyperintense on diffusion-weighted images but it is uncertain if these represent more acute lesions or shine through.  FH: She has one first cousin and one second cousin with MS.  One diagnosed in early 29's and one around age 51.     REVIEW OF SYSTEMS: Constitutional: No fevers, chills, sweats, or change in appetite.  She notes fatigue.  She has some insomnia at times. Eyes: No visual changes, double vision, eye pain Ear, nose and throat: No hearing loss, ear pain, nasal congestion, sore throat Cardiovascular: No chest pain, palpitations Respiratory: No shortness  of breath at rest or with exertion.   No wheezes GastrointestinaI: No nausea, vomiting, diarrhea, abdominal pain, fecal incontinence Genitourinary:She has urinary frequency.. Musculoskeletal:As above. Integumentary: No rash, pruritus, skin lesions Neurological: as above Psychiatric: No depression at this time.  No anxiety Endocrine: No palpitations, diaphoresis, change in appetite, change in weigh or increased thirst Hematologic/Lymphatic: No anemia, purpura, petechiae. Allergic/Immunologic: No itchy/runny eyes, nasal congestion, recent allergic reactions, rashes  ALLERGIES: No Known  Allergies  HOME MEDICATIONS:  Current Outpatient Medications:  .  Acetaminophen (ARTHRITIS PAIN PO), Take 2-4 tablets by mouth daily., Disp: , Rfl:  .  albuterol (PROVENTIL HFA;VENTOLIN HFA) 108 (90 Base) MCG/ACT inhaler, Inhale into the lungs., Disp: , Rfl:  .  fluticasone (FLONASE) 50 MCG/ACT nasal spray, SPRAY 2 SPRAYS INTO BOTH NOSTRILS DAILY, Disp: , Rfl:  .  Loratadine (CLARITIN REDITABS PO), Take by mouth., Disp: , Rfl:  .  Monomethyl Fumarate (BAFIERTAM) 95 MG CPDR, Take by mouth. Titration: 95mg  po BIDx7 days then 190mg  (2 caps po BID) thereafter for maintenance dose, Disp: , Rfl:  .  traMADol (ULTRAM) 50 MG tablet, Take 1 tablet (50 mg total) by mouth every 12 (twelve) hours as needed., Disp: 60 tablet, Rfl: 5 .  zolpidem (AMBIEN) 5 MG tablet, Take 1 tablet (5 mg total) by mouth at bedtime as needed for sleep., Disp: 30 tablet, Rfl: 3  PAST MEDICAL HISTORY: No past medical history on file.  PAST SURGICAL HISTORY: Past Surgical History:  Procedure Laterality Date  . TOOTH EXTRACTION  05/07/2016    FAMILY HISTORY: No family history on file.  SOCIAL HISTORY:  Social History   Socioeconomic History  . Marital status: Married    Spouse name: Not on file  . Number of children: Not on file  . Years of education: Not on file  . Highest education level: Not on file  Occupational History  . Occupation: [employer]-works from home  Tobacco Use  . Smoking status: Never Smoker  . Smokeless tobacco: Never Used  Substance and Sexual Activity  . Alcohol use: Never  . Drug use: Never  . Sexual activity: Not on file  Other Topics Concern  . Not on file  Social History Narrative   Lives with spouse and stepdaughters.   Caffeine use:    Starbucks frap 1-2 per week   Right handed    Social Determinants of Health   Financial Resource Strain:   . Difficulty of Paying Living Expenses: Not on file  Food Insecurity:   . Worried About in the Last  Year: Not on file  . Ran Out of Food in the Last Year: Not on file  Transportation Needs:   . Lack of Transportation (Medical): Not on file  . Lack of Transportation (Non-Medical): Not on file  Physical Activity:   . Days of Exercise per Week: Not on file  . Minutes of Exercise per Session: Not on file  Stress:   . Feeling of Stress : Not on file  Social Connections:   . Frequency of Communication with Friends and Family: Not on file  . Frequency of Social Gatherings with Friends and Family: Not on file  . Attends Religious Services: Not on file  . Active Member of Clubs or Organizations: Not on file  . Attends 05/09/2016 Meetings: Not on file  . Marital Status: Not on file  Intimate Partner Violence:   . Fear of Current or Ex-Partner: Not on file  . Emotionally  Abused: Not on file  . Physically Abused: Not on file  . Sexually Abused: Not on file     PHYSICAL EXAM  Vitals:   03/07/20 0824  BP: 115/78  Pulse: 66  SpO2: 98%  Weight: 294 lb (133.4 kg)  Height: 5\' 7"  (1.702 m)    Body mass index is 46.05 kg/m.   General: The patient is well-developed and well-nourished and in no acute distress  HEENT:  Head is [place]/AT.  Sclera are anicteric.   Neurologic Exam  Mental status: The patient is alert and oriented x 3 at the time of the examination. The patient has apparent normal recent and remote memory, with an apparently normal attention span and concentration ability.   Speech is normal.  Cranial nerves: Extraocular movements are full.   Colors are symmetric.    Facial symmetry is present. Facial strength is normal.  Trapezius and sternocleidomastoid strength is normal. No dysarthria is noted.   No obvious hearing deficits are noted.  Motor:  Muscle bulk is normal.   Tone is normal. Strength is  4+/5 in right arm, 5-/5 left leg and 5 / 5 elsewhere    Sensory: She has reduced touch/temperature in the right hand and left leg.   She has reduced vibration sensation  in the right arm and left leg.  Coordination: Cerebellar testing reveals good left but mildly reduced right finger-nose-finger and reduced left heel-to-shin .  Gait and station: Station is normal.   Gait is normal. Tandem gait is good now. Romberg is negative.   Reflexes: Deep tendon reflexes are symmetric and normal bilaterally.         ASSESSMENT AND PLAN  Multiple sclerosis ([facility]) - Plan: CBC with Differential/Platelet, Hepatic function panel, MR BRAIN W WO CONTRAST  High risk medication use - Plan: CBC with Differential/Platelet, Hepatic function panel  Gait disturbance  Dysesthesia  Vitamin D deficiency - Plan: VITAMIN D 25 Hydroxy (Vit-D Deficiency, Fractures)  1.   Continue monomethyl fumarate.  We will check some blood work today.  MRI brain to determine if she is having any subclinical progression.  If this is occurring, we may need to consider a different disease modifying therapy. 2.   Stay active and exercise as tolerated.. 3.   Continue Ambien. 4.   She will return to see me in 5 months or sooner if there are new or worsening neurologic symptoms.  Mays Paino A. , MD, [facility] 03/07/2020, 8:51 AM Certified in Neurology, Clinical Neurophysiology, Sleep Medicine and Neuroimaging  [facility] [address], [address] [place], [place] [phone]

## 2020-03-08 LAB — CBC WITH DIFFERENTIAL/PLATELET
Basophils Absolute: 0 10*3/uL (ref 0.0–0.2)
Basos: 0 %
EOS (ABSOLUTE): 0.1 10*3/uL (ref 0.0–0.4)
Eos: 2 %
Hematocrit: 37.1 % (ref 34.0–46.6)
Hemoglobin: 11.8 g/dL (ref 11.1–15.9)
Immature Grans (Abs): 0 10*3/uL (ref 0.0–0.1)
Immature Granulocytes: 1 %
Lymphocytes Absolute: 2.8 10*3/uL (ref 0.7–3.1)
Lymphs: 39 %
MCH: 26.5 pg — ABNORMAL LOW (ref 26.6–33.0)
MCHC: 31.8 g/dL (ref 31.5–35.7)
MCV: 83 fL (ref 79–97)
Monocytes Absolute: 0.7 10*3/uL (ref 0.1–0.9)
Monocytes: 9 %
Neutrophils Absolute: 3.6 10*3/uL (ref 1.4–7.0)
Neutrophils: 49 %
Platelets: 378 10*3/uL (ref 150–450)
RBC: 4.46 x10E6/uL (ref 3.77–5.28)
RDW: 13.9 % (ref 11.7–15.4)
WBC: 7.3 10*3/uL (ref 3.4–10.8)

## 2020-03-08 LAB — HEPATIC FUNCTION PANEL
ALT: 11 IU/L (ref 0–32)
AST: 13 IU/L (ref 0–40)
Albumin: 4 g/dL (ref 3.8–4.8)
Alkaline Phosphatase: 53 IU/L (ref 44–121)
Bilirubin Total: 0.3 mg/dL (ref 0.0–1.2)
Bilirubin, Direct: 0.11 mg/dL (ref 0.00–0.40)
Total Protein: 7 g/dL (ref 6.0–8.5)

## 2020-03-08 LAB — VITAMIN D 25 HYDROXY (VIT D DEFICIENCY, FRACTURES): Vit D, 25-Hydroxy: 24 ng/mL — ABNORMAL LOW (ref 30.0–100.0)

## 2020-03-12 ENCOUNTER — Telehealth: Payer: Self-pay | Admitting: Neurology

## 2020-03-12 NOTE — Telephone Encounter (Signed)
spoke to the patient she states she is going to call her insurance and give me a call back  [employer] bind auth: NPR Ref # [id number]

## 2020-03-19 ENCOUNTER — Other Ambulatory Visit: Payer: Self-pay | Admitting: Neurology

## 2020-04-19 ENCOUNTER — Other Ambulatory Visit: Payer: Self-pay | Admitting: Neurology

## 2020-04-25 ENCOUNTER — Other Ambulatory Visit: Payer: Self-pay | Admitting: Neurology

## 2020-05-21 ENCOUNTER — Telehealth: Payer: Self-pay | Admitting: Neurology

## 2020-05-21 NOTE — Telephone Encounter (Signed)
[employer] binded auth: NPR spoke to Tanya Fitzpatrick Ref # [id number] order faxed to [facility] per patient they will reach out to the patient to schedule. Ph [phone] & fax # [phone].

## 2020-05-23 NOTE — Telephone Encounter (Signed)
Called pt back. Pt reports breathing issues ongoing. PCP gave her inhaler to use prn. Headache started last night. More like migraine type headache. Had covid-19 last NOV. Does not think she has been exposed to anyone with covid-19. Headache constant. Took tylenol extra strength at 10am. Took excedrin recently. Denies any other covid-19 sx. Advised her to continue to monitor sx. If sx worsen, she should contact PCP for eval/treatment. She verbalized understanding and appreciation.

## 2020-05-23 NOTE — Telephone Encounter (Signed)
Marcelino Duster with [facility] called me and stated that the patient came in for a fit test but it was too tight for the patient. I called the patient and informed her the [facility] has an open MRI that may work for her. She states she was going to call her insurance and see how much it would cost to have it there and would give me a call back on what she decides to do.

## 2020-05-23 NOTE — Telephone Encounter (Signed)
Patient called me back and stated that she would like to go back to GI again. I informed her that I will send the order there and they will reach out to her to schedule.  She also stated that she feels like she is having more headaches and isn't sure if it is MS related and that her breathing has been off because she has had to use her inhaler more.

## 2020-05-25 ENCOUNTER — Ambulatory Visit
Admission: RE | Admit: 2020-05-25 | Discharge: 2020-05-25 | Disposition: A | Discharge status: Home / Self Care | Payer: Self-pay | Source: Ambulatory Visit | Attending: Neurology | Admitting: Neurology

## 2020-05-25 ENCOUNTER — Other Ambulatory Visit: Payer: Self-pay

## 2020-05-25 DIAGNOSIS — G35 Multiple sclerosis: Secondary | ICD-10-CM

## 2020-05-25 MED ORDER — GADOBENATE DIMEGLUMINE 529 MG/ML IV SOLN
20.0000 mL | Freq: Once | INTRAVENOUS | Status: AC | PRN
  Administered 2020-05-25: 20 mL via INTRAVENOUS

## 2020-06-08 ENCOUNTER — Other Ambulatory Visit: Payer: Self-pay | Admitting: Neurology

## 2020-07-08 ENCOUNTER — Telehealth: Payer: Self-pay | Admitting: *Deleted

## 2020-07-08 NOTE — Telephone Encounter (Signed)
Submitted PA on CMM. Key: [id number]. Waiting on determination from [facility].

## 2020-07-08 NOTE — Telephone Encounter (Signed)
Request Reference Number: [id number]. BAFIERTAM CAP 95MG  is approved through 07/08/2021. Your patient may now fill this prescription and it will be covered.

## 2020-07-17 ENCOUNTER — Other Ambulatory Visit: Payer: Self-pay | Admitting: Neurology

## 2020-09-09 ENCOUNTER — Other Ambulatory Visit: Payer: Self-pay | Admitting: Neurology

## 2020-09-11 ENCOUNTER — Other Ambulatory Visit: Payer: Self-pay

## 2020-09-11 ENCOUNTER — Encounter: Payer: Self-pay | Admitting: Neurology

## 2020-09-11 ENCOUNTER — Ambulatory Visit (INDEPENDENT_AMBULATORY_CARE_PROVIDER_SITE_OTHER): Payer: No Typology Code available for payment source | Admitting: Neurology

## 2020-09-11 VITALS — BP 138/89 | HR 70 | Ht 67.0 in | Wt 304.0 lb

## 2020-09-11 DIAGNOSIS — R208 Other disturbances of skin sensation: Secondary | ICD-10-CM

## 2020-09-11 DIAGNOSIS — G35 Multiple sclerosis: Secondary | ICD-10-CM

## 2020-09-11 DIAGNOSIS — R269 Unspecified abnormalities of gait and mobility: Secondary | ICD-10-CM

## 2020-09-11 DIAGNOSIS — E559 Vitamin D deficiency, unspecified: Secondary | ICD-10-CM | POA: Insufficient documentation

## 2020-09-11 DIAGNOSIS — Z79899 Other long term (current) drug therapy: Secondary | ICD-10-CM | POA: Diagnosis not present

## 2020-09-11 NOTE — Progress Notes (Signed)
[facility]  PATIENT: Tanya Fitzpatrick DOB: 12/03/1980  REFERRING DOCTOR OR PCP: Abran Duke, MD SOURCE: Patient, notes from primary care, imaging and lab reports, MRI images personally reviewed.  _________________________________   HISTORICAL  CHIEF COMPLAINT:  Chief Complaint  Patient presents with  . Follow-up    RM 12, alone. Last seen 03/07/2020. On Bafiertam for MS. Unable to walk long distances. Has back pain. She has had this for years.     HISTORY OF PRESENT ILLNESS:  Tanya Fitzpatrick is a 40 year old woman with relapsing remitting multiple sclerosis.  Update 09/11/2020: She is on Bafiertam (MMF) as a DMT for her MS.   She denies exacerbation.   Her right side gets sore and she has some right leg stiffness.  She feels balance is slightly off but she is generally doing well.  She can go downstairs without using the bannister most times but not every time.    Right arm numbness improved and clumsiness is now very mild.  She feels the right hand grip is back to normal.   She has urinary urgency but not incontinence.   Vision  Is about the same.   Colors are the same out of either eye.    She has LBP if she walks longer distance.    Generally it is better this year than last year.   She has lost 10 pounds the last month (just started weight management program and may have the gastric sleeve procedure). Some neck pain too.    Gabapentin and tramadol has helped the pain.     She has mild fatigue, a little more since she stopped taking vitamin D.  She is no longer on phentermine.  She sleeps well most nights on Ambien 5 mg.   She works early am.  She works at home and we discussed staying active.    MS HISTORY: She was diagnosed with MS 05/2019.      In February 2020, she had numbness in the chest bilaterally and in the right arm.   Over the next month, the numbness resolved in the chest but continued in the right arm.  She reports that an MRI had been  scheduled but due to COVID-19 and changes in schedules, she never did the study.     She had Covid-19 in mid November 2020 and she noted more headaches but did not require hospitalization.  She noted numbness in the left hand that started in early December.  MRI 05/24/2019 was c/w MS and she was referred.  She was started on Bafiertam (MMF) February 2021.    IMAGING MRI of the brain (without contrast) 05/24/2019 shows multiple T2/FLAIR hyperintense foci in the periventricular, juxtacortical and deep white matter.  Additionally there are some foci in the cerebellum and brainstem.  The pattern is very consistent with multiple sclerosis.  Some foci are hyperintense on diffusion-weighted images but it is uncertain if these represent more acute lesions or shine through.  MRI of the brain 05/25/2020 showed a couple T2/FLAIR hyperintense foci in the hemispheres and left pons in a pattern and configuration consistent with chronic demyelinating plaque associated with multiple sclerosis.  None of the foci appear to be acute.  They do not enhance.  Compared to the MRI from 05/24/2019, there are no new lesions.       Normal enhancement pattern.  No acute findings  FH: She has one first cousin and one second cousin with MS.  One diagnosed in early 31's and one  around age 81.     REVIEW OF SYSTEMS: Constitutional: No fevers, chills, sweats, or change in appetite.  She notes fatigue.  She has some insomnia at times. Eyes: No visual changes, double vision, eye pain Ear, nose and throat: No hearing loss, ear pain, nasal congestion, sore throat Cardiovascular: No chest pain, palpitations Respiratory: No shortness of breath at rest or with exertion.   No wheezes GastrointestinaI: No nausea, vomiting, diarrhea, abdominal pain, fecal incontinence Genitourinary:She has urinary frequency.. Musculoskeletal:As above. Integumentary: No rash, pruritus, skin lesions Neurological: as above Psychiatric: No depression at this  time.  No anxiety Endocrine: No palpitations, diaphoresis, change in appetite, change in weigh or increased thirst Hematologic/Lymphatic: No anemia, purpura, petechiae. Allergic/Immunologic: No itchy/runny eyes, nasal congestion, recent allergic reactions, rashes  ALLERGIES: No Known Allergies  HOME MEDICATIONS:  Current Outpatient Medications:  .  Acetaminophen (ARTHRITIS PAIN PO), Take 2-4 tablets by mouth daily., Disp: , Rfl:  .  albuterol (PROVENTIL HFA;VENTOLIN HFA) 108 (90 Base) MCG/ACT inhaler, Inhale into the lungs., Disp: , Rfl:  .  BAFIERTAM 95 MG CPDR, TAKE 2 CAPSULES (190MG ) BY  MOUTH TWICE DAILY, Disp: 120 capsule, Rfl: 11 .  cyclobenzaprine (FLEXERIL) 5 MG tablet, TAKE 1 TABLET(5 MG) BY MOUTH THREE TIMES DAILY AS NEEDED FOR MUSCLE SPASMS, Disp: 30 tablet, Rfl: 0 .  fluticasone (FLONASE) 50 MCG/ACT nasal spray, SPRAY 2 SPRAYS INTO BOTH NOSTRILS DAILY, Disp: , Rfl:  .  gabapentin (NEURONTIN) 300 MG capsule, Take 1 capsule (300 mg total) by mouth 3 (three) times daily., Disp: 90 capsule, Rfl: 11 .  Loratadine (CLARITIN REDITABS PO), Take by mouth., Disp: , Rfl:  .  traMADol (ULTRAM) 50 MG tablet, TAKE 1 TABLET(50 MG) BY MOUTH EVERY 12 HOURS AS NEEDED, Disp: 60 tablet, Rfl: 5 .  zolpidem (AMBIEN) 5 MG tablet, TAKE 1 TABLET(5 MG) BY MOUTH AT BEDTIME AS NEEDED FOR SLEEP, Disp: 30 tablet, Rfl: 3  PAST MEDICAL HISTORY: No past medical history on file.  PAST SURGICAL HISTORY: Past Surgical History:  Procedure Laterality Date  . TOOTH EXTRACTION  05/07/2016    FAMILY HISTORY: No family history on file.  SOCIAL HISTORY:  Social History   Socioeconomic History  . Marital status: Married    Spouse name: Not on file  . Number of children: Not on file  . Years of education: Not on file  . Highest education level: Not on file  Occupational History  . Occupation: [employer]-works from home  Tobacco Use  . Smoking status: Never Smoker  . Smokeless tobacco: Never  Used  Substance and Sexual Activity  . Alcohol use: Never  . Drug use: Never  . Sexual activity: Not on file  Other Topics Concern  . Not on file  Social History Narrative   Lives with spouse and stepdaughters.   Caffeine use:    Starbucks frap 1-2 per week   Right handed    Social Determinants of Health   Financial Resource Strain: Not on file  Food Insecurity: Not on file  Transportation Needs: Not on file  Physical Activity: Not on file  Stress: Not on file  Social Connections: Not on file  Intimate Partner Violence: Not on file     PHYSICAL EXAM  Vitals:   09/11/20 0826  BP: 138/89  Pulse: 70  Weight: (!) 304 lb (137.9 kg)  Height: 5\' 7"  (1.702 m)    Body mass index is 47.61 kg/m.   General: The patient is well-developed and well-nourished and in  no acute distress  HEENT:  Head is [place]/AT.  Sclera are anicteric.   Neurologic Exam  Mental status: The patient is alert and oriented x 3 at the time of the examination. The patient has apparent normal recent and remote memory, with an apparently normal attention span and concentration ability.   Speech is normal.  Cranial nerves: Extraocular movements are full.   Colors are symmetric.    Facial symmetry is present. Facial strength is normal.  Trapezius and sternocleidomastoid strength is normal. No dysarthria is noted.   No obvious hearing deficits are noted.  Motor:  Muscle bulk is normal.   Tone is normal. Strength is  Now  5/5 in both arms and legs.   Sensory: She has reduced touch/temperature in the right hand and left leg.   She has reduced vibration sensation in the right arm and left leg.  Coordination: Cerebellar testing reveals good left but mildly reduced right finger-nose-finger and reduced left heel-to-shin .  Gait and station: Station is normal.   Normal gait and tandem gait.. Romberg is negative.   Reflexes: Deep tendon reflexes are symmetric and normal bilaterally.         ASSESSMENT AND  PLAN  No diagnosis found.  1.   Continue Bafiertam (monomethyl fumarate).  We will check some blood work today.  Around next January, MRI brain to determine if she is having any subclinical progression.  If this is occurring, we may need to consider a different disease modifying therapy. 2.   Stay active and exercise as tolerated.. 3.   Continue Ambien. 4.   She will return to see me in 5 months or sooner if there are new or worsening neurologic symptoms.  Kassadi Presswood A. Epimenio Foot, MD, [facility] 09/11/2020, 8:42 AM Certified in Neurology, Clinical Neurophysiology, Sleep Medicine and Neuroimaging  [facility] [address] [place], [place] [address] [phone]

## 2020-10-29 ENCOUNTER — Other Ambulatory Visit: Payer: Self-pay | Admitting: Neurology

## 2020-11-06 ENCOUNTER — Other Ambulatory Visit: Payer: Self-pay | Admitting: Surgical Oncology

## 2020-11-06 DIAGNOSIS — K449 Diaphragmatic hernia without obstruction or gangrene: Secondary | ICD-10-CM

## 2020-12-03 ENCOUNTER — Ambulatory Visit
Admission: RE | Admit: 2020-12-03 | Discharge: 2020-12-03 | Disposition: A | Discharge status: Home / Self Care | Payer: No Typology Code available for payment source | Source: Ambulatory Visit | Attending: Surgical Oncology | Admitting: Surgical Oncology

## 2020-12-03 ENCOUNTER — Other Ambulatory Visit: Payer: Self-pay | Admitting: Surgical Oncology

## 2020-12-03 DIAGNOSIS — K449 Diaphragmatic hernia without obstruction or gangrene: Secondary | ICD-10-CM

## 2021-03-11 NOTE — Progress Notes (Signed)
Chief Complaint  Patient presents with   Follow-up    Rm 2, alone. Here for 6 month MS f/u on bafiertam. Pt reports R arm pn come and goes. Has a hard time opening a jar at times.      HISTORY OF PRESENT ILLNESS:  03/13/21 ALL:  Tanya Fitzpatrick is a 40 y.o. female here today for follow up for RRMS. She continues Bafiertam 190mg  BID. Last MRI 05/2020 stable. Labs have been stable.   She feels that she is doing well. No new or exacerbating symptoms. Gait stable. She continues to have right shoulder and arm pain. Cyclobenzaprine 5mg  as needed helps with pain and insomnia. She continues gabapentin 300mg  BID (decreased from TID while in weight management program). She continues Tramadol 50mg  BID. PDMP appropriate.   She feels that she is sleeping well on Zolpidem 5mg . She tries not to take it every day. Brain fog and short term memory wax and wane. She has tried to be more active. Last vitamin D was 23. She has been taking 50,000iu weekly.   Vision is better after getting new prescription.   She is planning to have bariatric surgery (sleeve) in December. She is working with weight management regularly.   HISTORY (copied from Dr previous note)  Tanya Fitzpatrick is a 40 year old woman with relapsing remitting multiple sclerosis.   Update 09/11/2020: She is on Bafiertam (MMF) as a DMT for her MS.   She denies exacerbation.   Her right side gets sore and she has some right leg stiffness.  She feels balance is slightly off but she is generally doing well.  She can go downstairs without using the bannister most times but not every time.    Right arm numbness improved and clumsiness is now very mild.  She feels the right hand grip is back to normal.   She has urinary urgency but not incontinence.   Vision  Is about the same.   Colors are the same out of either eye.     She has LBP if she walks longer distance.    Generally it is better this year than last year.   She has lost 10 pounds  the last month (just started weight management program and may have the gastric sleeve procedure). Some neck pain too.    Gabapentin and tramadol has helped the pain.      She has mild fatigue, a little more since she stopped taking vitamin D.  She is no longer on phentermine.  She sleeps well most nights on Ambien 5 mg.   She works early am.  She works at home and we discussed staying active.     MS HISTORY: She was diagnosed with MS 05/2019.      In February 2020, she had numbness in the chest bilaterally and in the right arm.   Over the next month, the numbness resolved in the chest but continued in the right arm.  She reports that an MRI had been scheduled but due to COVID-19 and changes in schedules, she never did the study.     She had Covid-19 in mid November 2020 and she noted more headaches but did not require hospitalization.  She noted numbness in the left hand that started in early December.  MRI 05/24/2019 was c/w MS and she was referred.  She was started on Bafiertam (MMF) February 2021.     IMAGING MRI of the brain (without contrast) 05/24/2019 shows multiple T2/FLAIR hyperintense foci in  the periventricular, juxtacortical and deep white matter.  Additionally there are some foci in the cerebellum and brainstem.  The pattern is very consistent with multiple sclerosis.  Some foci are hyperintense on diffusion-weighted images but it is uncertain if these represent more acute lesions or shine through.   MRI of the brain 05/25/2020 showed a couple T2/FLAIR hyperintense foci in the hemispheres and left pons in a pattern and configuration consistent with chronic demyelinating plaque associated with multiple sclerosis.  None of the foci appear to be acute.  They do not enhance.  Compared to the MRI from 05/24/2019, there are no new lesions.       Normal enhancement pattern.  No acute findings   FH: She has one first cousin and one second cousin with MS.  One diagnosed in early 53's and one around age  81.   REVIEW OF SYSTEMS: Out of a complete 14 system review of symptoms, the patient complains only of the following symptoms, right arm pain, brain fog, anxiety, and all other reviewed systems are negative.   ALLERGIES: No Known Allergies   HOME MEDICATIONS: Outpatient Medications Prior to Visit  Medication Sig Dispense Refill   Acetaminophen (ARTHRITIS PAIN PO) Take 2-4 tablets by mouth daily.     albuterol (PROVENTIL HFA;VENTOLIN HFA) 108 (90 Base) MCG/ACT inhaler Inhale into the lungs.     BAFIERTAM 95 MG CPDR TAKE 2 CAPSULES (190MG ) BY  MOUTH TWICE DAILY 120 capsule 11   cyclobenzaprine (FLEXERIL) 5 MG tablet TAKE 1 TABLET(5 MG) BY MOUTH THREE TIMES DAILY AS NEEDED FOR MUSCLE SPASMS 30 tablet 0   fluticasone (FLONASE) 50 MCG/ACT nasal spray SPRAY 2 SPRAYS INTO BOTH NOSTRILS DAILY     Loratadine (CLARITIN REDITABS PO) Take by mouth.     SYMBICORT 160-4.5 MCG/ACT inhaler Inhale 2 puffs into the lungs as needed.     gabapentin (NEURONTIN) 300 MG capsule Take 1 capsule (300 mg total) by mouth 3 (three) times daily. 90 capsule 11   traMADol (ULTRAM) 50 MG tablet TAKE 1 TABLET(50 MG) BY MOUTH EVERY 12 HOURS AS NEEDED 60 tablet 5   zolpidem (AMBIEN) 5 MG tablet TAKE 1 TABLET(5 MG) BY MOUTH AT BEDTIME AS NEEDED FOR SLEEP 30 tablet 3   No facility-administered medications prior to visit.     PAST MEDICAL HISTORY: History reviewed. No pertinent past medical history.   PAST SURGICAL HISTORY: Past Surgical History:  Procedure Laterality Date   TOOTH EXTRACTION  05/07/2016     FAMILY HISTORY: History reviewed. No pertinent family history.   SOCIAL HISTORY: Social History   Socioeconomic History   Marital status: Married    Spouse name: Not on file   Number of children: Not on file   Years of education: Not on file   Highest education level: Not on file  Occupational History   Occupation: [employer]-works from home  Tobacco Use   Smoking status: Never    Smokeless tobacco: Never  Substance and Sexual Activity   Alcohol use: Never   Drug use: Never   Sexual activity: Not on file  Other Topics Concern   Not on file  Social History Narrative   Lives with spouse and stepdaughters.   Caffeine use:    Starbucks frap 1-2 per week   Right handed    Social Determinants of Health   Financial Resource Strain: Not on file  Food Insecurity: Not on file  Transportation Needs: Not on file  Physical Activity: Not on file  Stress:  Not on file  Social Connections: Not on file  Intimate Partner Violence: Not on file     PHYSICAL EXAM  Vitals:   03/13/21 0900  BP: 122/78  Pulse: 73  Weight: 294 lb 8 oz (133.6 kg)  Height: 5\' 7"  (1.702 m)   Body mass index is 46.13 kg/m.  Generalized: Well developed, in no acute distress  Cardiology: normal rate and rhythm, no murmur auscultated  Respiratory: clear to auscultation bilaterally    Neurological examination  Mentation: Alert oriented to time, place, history taking. Follows all commands speech and language fluent Cranial nerve II-XII: Pupils were equal round reactive to light. Extraocular movements were full, visual field were full on confrontational test. Facial sensation and strength were normal. Uvula tongue midline. Head turning and shoulder shrug  were normal and symmetric. Motor: The motor testing reveals 5 over 5 strength of all 4 extremities. Good symmetric motor tone is noted throughout.  Sensory: Sensory testing is intact to soft touch on all 4 extremities. No evidence of extinction is noted.  Coordination: Cerebellar testing reveals good finger-nose-finger and heel-to-shin bilaterally.  Gait and station: Gait is normal. Tandem gait is normal. Romberg is negative. No drift is seen.  Reflexes: Deep tendon reflexes are symmetric and normal bilaterally.    DIAGNOSTIC DATA (LABS, IMAGING, TESTING) - I reviewed patient records, labs, notes, testing and imaging myself where  available.  Lab Results  Component Value Date   WBC 7.3 03/07/2020   HGB 11.8 03/07/2020   HCT 37.1 03/07/2020   MCV 83 03/07/2020   PLT 378 03/07/2020      Component Value Date/Time   NA 141 06/21/2019 1509   K 5.0 06/21/2019 1509   CL 104 06/21/2019 1509   CO2 24 06/21/2019 1509   GLUCOSE 74 06/21/2019 1509   BUN 8 06/21/2019 1509   CREATININE 0.66 06/21/2019 1509   CALCIUM 9.6 06/21/2019 1509   PROT 7.0 03/07/2020 0901   ALBUMIN 4.0 03/07/2020 0901   AST 13 03/07/2020 0901   ALT 11 03/07/2020 0901   ALKPHOS 53 03/07/2020 0901   BILITOT 0.3 03/07/2020 0901   GFRNONAA 112 06/21/2019 1509   GFRAA 130 06/21/2019 1509   No results found for: CHOL, HDL, LDLCALC, LDLDIRECT, TRIG, CHOLHDL No results found for: 08/19/2019 No results found for: VITAMINB12 No results found for: TSH  No flowsheet data found.   No flowsheet data found.   ASSESSMENT AND PLAN  40 y.o. year old female  has no past medical history on file. here with    Multiple sclerosis (HCC) - Plan: CBC with Differential/Platelets, Hepatic Function Panel  High risk medication use - Plan: CBC with Differential/Platelets, Hepatic Function Panel  Vitamin D deficiency  Dysesthesia  Brain fog  Hema is doing well, today. We will continue current treatment plan. We will update labs, today. PDMP shows appropriate refills. She will continue working closely with bariatrics. Healthy lifestyle habits encouraged. Memory compensation strategies reviewed. She will follow up in 6 months.    Orders Placed This Encounter  Procedures   CBC with Differential/Platelets   Hepatic Function Panel      Meds ordered this encounter  Medications   gabapentin (NEURONTIN) 300 MG capsule    Sig: Take 1 capsule (300 mg total) by mouth 2 (two) times daily.    Dispense:  180 capsule    Refill:  3    Order Specific Question:   Supervising Provider    Answer:   24 Anson Fret  traMADol (ULTRAM) 50 MG tablet     Sig: TAKE 1 TABLET(50 MG) BY MOUTH EVERY 12 HOURS AS NEEDED    Dispense:  60 tablet    Refill:  0    FILL ON OR AFTER 09/13/20. Dr. Anne Hahn covering MD, Dr. Epimenio Foot out    Order Specific Question:   Supervising Provider    Answer:   Anson Fret [[id number]]   zolpidem (AMBIEN) 5 MG tablet    Sig: TAKE 1 TABLET(5 MG) BY MOUTH AT BEDTIME AS NEEDED FOR SLEEP    Dispense:  90 tablet    Refill:  1    FILL ON OR AFTER 11/08/2020    Order Specific Question:   Supervising Provider    Answer:   Anson Fret [[id number]]       Shawnie Dapper, MSN, FNP-C 03/13/2021, 9:40 AM  [facility] [address] [place], [place] [address] [phone]

## 2021-03-11 NOTE — Patient Instructions (Addendum)
Below is our plan: ? ?We will continue current treatment plan ? ?Please make sure you are staying well hydrated. I recommend 50-60 ounces daily. Well balanced diet and regular exercise encouraged. Consistent sleep schedule with 6-8 hours recommended.  ? ?Please continue follow up with care team as directed.  ? ?Follow up with Dr Sater in 6 months  ? ?You may receive a survey regarding today's visit. I encourage you to leave honest feed back as I do use this information to improve patient care. Thank you for seeing me today!  ?Management of Memory Problems ?  ?There are some general things you can do to help manage your memory problems.  Your memory may not in fact recover, but by using techniques and strategies you will be able to manage your memory difficulties better. ?  ?1)  Establish a routine. ?Try to establish and then stick to a regular routine.  By doing this, you will get used to what to expect and you will reduce the need to rely on your memory.  Also, try to do things at the same time of day, such as taking your medication or checking your calendar first thing in the morning. ?Think about think that you can do as a part of a regular routine and make a list.  Then enter them into a daily planner to remind you.  This will help you establish a routine. ?  ?2)  Organize your environment. ?Organize your environment so that it is uncluttered.  Decrease visual stimulation.  Place everyday items such as keys or cell phone in the same place every day (ie.  Basket next to front door) ?Use post it notes with a brief message to yourself (ie. Turn off light, lock the door) ?Use labels to indicate where things go (ie. Which cupboards are for food, dishes, etc.) ?Keep a notepad and pen by the telephone to take messages ?  ?3)  Memory Aids ?A diary or journal/notebook/daily planner ?Making a list (shopping list, chore list, to do list that needs to be done) ?Using an alarm as a reminder (kitchen timer or cell phone  alarm) ?Using cell phone to store information (Notes, Calendar, Reminders) ?Calendar/White board placed in a prominent position ?Post-it notes ?  ?In order for memory aids to be useful, you need to have good habits.  It's no good remembering to make a note in your journal if you don't remember to look in it.  Try setting aside a certain time of day to look in journal. ?  ?4)  Improving mood and managing fatigue. ?There may be other factors that contribute to memory difficulties.  Factors, such as anxiety, depression and tiredness can affect memory. ?Regular gentle exercise can help improve your mood and give you more energy. ?Simple relaxation techniques may help relieve symptoms of anxiety ?Try to get back to completing activities or hobbies you enjoyed doing in the past. ?Learn to pace yourself through activities to decrease fatigue. ?Find out about some local support groups where you can share experiences with others. ?Try and achieve 7-8 hours of sleep at night. ? ? ? ?

## 2021-03-13 ENCOUNTER — Encounter: Payer: Self-pay | Admitting: Family Medicine

## 2021-03-13 ENCOUNTER — Ambulatory Visit (INDEPENDENT_AMBULATORY_CARE_PROVIDER_SITE_OTHER): Payer: No Typology Code available for payment source | Admitting: Family Medicine

## 2021-03-13 ENCOUNTER — Other Ambulatory Visit: Payer: Self-pay

## 2021-03-13 VITALS — BP 122/78 | HR 73 | Ht 67.0 in | Wt 294.5 lb

## 2021-03-13 DIAGNOSIS — E559 Vitamin D deficiency, unspecified: Secondary | ICD-10-CM

## 2021-03-13 DIAGNOSIS — G35 Multiple sclerosis: Secondary | ICD-10-CM

## 2021-03-13 DIAGNOSIS — R4189 Other symptoms and signs involving cognitive functions and awareness: Secondary | ICD-10-CM

## 2021-03-13 DIAGNOSIS — R208 Other disturbances of skin sensation: Secondary | ICD-10-CM

## 2021-03-13 DIAGNOSIS — Z79899 Other long term (current) drug therapy: Secondary | ICD-10-CM | POA: Diagnosis not present

## 2021-03-13 MED ORDER — GABAPENTIN 300 MG PO CAPS: 300.0000 mg | ORAL_CAPSULE | Freq: Two times a day (BID) | ORAL | 3 refills | Status: DC

## 2021-03-13 MED ORDER — TRAMADOL HCL 50 MG PO TABS: ORAL_TABLET | ORAL | 0 refills | Status: DC

## 2021-03-13 MED ORDER — ZOLPIDEM TARTRATE 5 MG PO TABS: ORAL_TABLET | ORAL | 1 refills | Status: DC

## 2021-03-14 LAB — HEPATIC FUNCTION PANEL
ALT: 24 IU/L (ref 0–32)
AST: 22 IU/L (ref 0–40)
Albumin: 4.3 g/dL (ref 3.8–4.8)
Alkaline Phosphatase: 64 IU/L (ref 44–121)
Bilirubin Total: 0.5 mg/dL (ref 0.0–1.2)
Bilirubin, Direct: 0.16 mg/dL (ref 0.00–0.40)
Total Protein: 7.3 g/dL (ref 6.0–8.5)

## 2021-03-14 LAB — CBC WITH DIFFERENTIAL/PLATELET
Basophils Absolute: 0 10*3/uL (ref 0.0–0.2)
Basos: 0 %
EOS (ABSOLUTE): 0.1 10*3/uL (ref 0.0–0.4)
Eos: 2 %
Hematocrit: 38.8 % (ref 34.0–46.6)
Hemoglobin: 12 g/dL (ref 11.1–15.9)
Immature Grans (Abs): 0 10*3/uL (ref 0.0–0.1)
Immature Granulocytes: 0 %
Lymphocytes Absolute: 2.9 10*3/uL (ref 0.7–3.1)
Lymphs: 46 %
MCH: 25.3 pg — ABNORMAL LOW (ref 26.6–33.0)
MCHC: 30.9 g/dL — ABNORMAL LOW (ref 31.5–35.7)
MCV: 82 fL (ref 79–97)
Monocytes Absolute: 0.5 10*3/uL (ref 0.1–0.9)
Monocytes: 8 %
Neutrophils Absolute: 2.8 10*3/uL (ref 1.4–7.0)
Neutrophils: 44 %
Platelets: 398 10*3/uL (ref 150–450)
RBC: 4.74 x10E6/uL (ref 3.77–5.28)
RDW: 14.4 % (ref 11.7–15.4)
WBC: 6.4 10*3/uL (ref 3.4–10.8)

## 2021-04-14 ENCOUNTER — Other Ambulatory Visit: Payer: Self-pay

## 2021-04-14 MED ORDER — TRAMADOL HCL 50 MG PO TABS
ORAL_TABLET | ORAL | 0 refills | Status: DC
Start: 2021-04-14 — End: 2021-05-13

## 2021-04-14 NOTE — Progress Notes (Signed)
Received refill request for Tramadol.  Last OV was on 03/13/21.  Next OV is scheduled for 09/11/21 .  Last RX was written on 03/18/21 for 60 tabs.   [place] has been reviewed.

## 2021-05-12 ENCOUNTER — Other Ambulatory Visit: Payer: Self-pay | Admitting: Neurology

## 2021-06-07 ENCOUNTER — Other Ambulatory Visit: Payer: Self-pay | Admitting: Neurology

## 2021-08-05 ENCOUNTER — Telehealth: Payer: Self-pay | Admitting: Neurology

## 2021-08-05 NOTE — Telephone Encounter (Signed)
PA completed for the pt through [facility] ?KEY: [id number] ?Will await determination ?

## 2021-08-13 NOTE — Telephone Encounter (Signed)
PA approved-  ?Reference Number: [id number]. BAFIERTAM CAP 95MG  is approved through 08/06/2022.  ?

## 2021-09-10 ENCOUNTER — Encounter: Payer: Self-pay | Admitting: Neurology

## 2021-09-10 ENCOUNTER — Ambulatory Visit (INDEPENDENT_AMBULATORY_CARE_PROVIDER_SITE_OTHER): Payer: No Typology Code available for payment source | Admitting: Neurology

## 2021-09-10 VITALS — BP 122/77 | HR 57 | Ht 68.0 in | Wt 241.0 lb

## 2021-09-10 DIAGNOSIS — G35 Multiple sclerosis: Secondary | ICD-10-CM | POA: Diagnosis not present

## 2021-09-10 DIAGNOSIS — R200 Anesthesia of skin: Secondary | ICD-10-CM

## 2021-09-10 DIAGNOSIS — M25511 Pain in right shoulder: Secondary | ICD-10-CM

## 2021-09-10 DIAGNOSIS — Z79899 Other long term (current) drug therapy: Secondary | ICD-10-CM | POA: Diagnosis not present

## 2021-09-10 DIAGNOSIS — M542 Cervicalgia: Secondary | ICD-10-CM | POA: Diagnosis not present

## 2021-09-10 MED ORDER — TRAMADOL HCL 50 MG PO TABS: ORAL_TABLET | ORAL | 5 refills | Status: DC

## 2021-09-10 MED ORDER — METHYLPREDNISOLONE 4 MG PO TABS: ORAL_TABLET | ORAL | 0 refills | Status: DC

## 2021-09-10 MED ORDER — ZOLPIDEM TARTRATE 5 MG PO TABS: ORAL_TABLET | ORAL | 1 refills | Status: DC

## 2021-09-10 NOTE — Progress Notes (Signed)
? ?[facility] ? ?PATIENT: Tanya Fitzpatrick ?DOB: 09/14/1980 ? ?REFERRING DOCTOR OR PCP: Tanya Duke, MD ?SOURCE: Patient, notes from primary care, imaging and lab reports, MRI images personally reviewed. ? ?_________________________________ ? ? ?HISTORICAL ? ?CHIEF COMPLAINT:  ?Chief Complaint  ?Patient presents with  ? Follow-up  ?  Pt alone, rm 2. Overall stable. No major concerns or changes. MS follow up. DMT: Tanya Fitzpatrick  ? ? ?HISTORY OF PRESENT ILLNESS:  ?Tanya Fitzpatrick is a 41 year old woman with relapsing remitting multiple sclerosis. ? ?Update 09/10/2021: ?She is on Bafiertam (MMF) as a DMT for her MS.   She denies exacerbation.    ? ?She is walking well and had oe fall on the stairs and had a carpet burn but no injury.    She sometimes holds the rail on the stairs.  She walkes 8 mles one day and often walks 3+ miles.   She report right shoulder pain.    Right arm numbness improved with time and clumsiness is now very mild.  She feels the right hand grip is back to normal.   She has urinary urgency but not incontinence.   Vision is stable.No color asymmetry.   Feels vision is worse at night when she drives.  ? ?LBP is better with weight loss.  She has lost 80 pounds with diet initially and then gastric sleeve procedure.       ? ?Gabapentin and tramadol has helped the pain.    ? ?She has mild fatigue.  She is sometimes sleepy.  She does not snore..  She sleeps well most nights on Ambien 5 mg.   ? ?MS HISTORY: ?She was diagnosed with MS 05/2019.      In February 2020, she had numbness in the chest bilaterally and in the right arm.   Over the next month, the numbness resolved in the chest but continued in the right arm.  She reports that an MRI had been scheduled but due to COVID-19 and changes in schedules, she never did the study.     She had Covid-19 in mid November 2020 and she noted more headaches but did not require hospitalization.  She noted numbness in the left hand that started  in early December.  MRI 05/24/2019 was c/w MS and she was referred.  She was started on Bafiertam (MMF) February 2021.   ? ?IMAGING ?MRI of the brain (without contrast) 05/24/2019 shows multiple T2/FLAIR hyperintense foci in the periventricular, juxtacortical and deep white matter.  Additionally there are some foci in the cerebellum and brainstem.  The pattern is very consistent with multiple sclerosis.  Some foci are hyperintense on diffusion-weighted images but it is uncertain if these represent more acute lesions or shine through. ? ?MRI of the brain 05/25/2020 showed a couple T2/FLAIR hyperintense foci in the hemispheres and left pons in a pattern and configuration consistent with chronic demyelinating plaque associated with multiple sclerosis.  None of the foci appear to be acute.  They do not enhance.  Compared to the MRI from 05/24/2019, there are no new lesions.       Normal enhancement pattern.  No acute findings ? ?FH: ?She has one first cousin and one second cousin with MS.  One diagnosed in early 76's and one around age 71.    ? ?REVIEW OF SYSTEMS: ?Constitutional: No fevers, chills, sweats, or change in appetite.  She notes fatigue.  She has some insomnia at times. ?Eyes: No visual changes, double vision, eye pain ?Ear, nose and  throat: No hearing loss, ear pain, nasal congestion, sore throat ?Cardiovascular: No chest pain, palpitations ?Respiratory:  No shortness of breath at rest or with exertion.   No wheezes ?GastrointestinaI: No nausea, vomiting, diarrhea, abdominal pain, fecal incontinence ?Genitourinary: She has urinary frequency.Marland Kitchen ?Musculoskeletal: As above. ?Integumentary: No rash, pruritus, skin lesions ?Neurological: as above ?Psychiatric: No depression at this time.  No anxiety ?Endocrine: No palpitations, diaphoresis, change in appetite, change in weigh or increased thirst ?Hematologic/Lymphatic:  No anemia, purpura, petechiae. ?Allergic/Immunologic: No itchy/runny eyes, nasal congestion, recent  allergic reactions, rashes ? ?ALLERGIES: ?No Known Allergies ? ?HOME MEDICATIONS: ? ?Current Outpatient Medications:  ?  Acetaminophen (ARTHRITIS PAIN PO), Take 2-4 tablets by mouth daily., Disp: , Rfl:  ?  albuterol (PROVENTIL HFA;VENTOLIN HFA) 108 (90 Base) MCG/ACT inhaler, Inhale into the lungs., Disp: , Rfl:  ?  BAFIERTAM 95 MG CPDR, TAKE 2 CAPSULES (190MG ) BY  MOUTH TWICE DAILY, Disp: 120 capsule, Rfl: 11 ?  cyclobenzaprine (FLEXERIL) 5 MG tablet, TAKE 1 TABLET(5 MG) BY MOUTH THREE TIMES DAILY AS NEEDED FOR MUSCLE SPASMS, Disp: 30 tablet, Rfl: 0 ?  fluticasone (FLONASE) 50 MCG/ACT nasal spray, SPRAY 2 SPRAYS INTO BOTH NOSTRILS DAILY, Disp: , Rfl:  ?  gabapentin (NEURONTIN) 300 MG capsule, Take 1 capsule (300 mg total) by mouth 2 (two) times daily., Disp: 180 capsule, Rfl: 3 ?  Loratadine (CLARITIN REDITABS PO), Take by mouth., Disp: , Rfl:  ?  methylPREDNISolone (MEDROL) 4 MG tablet, Taper from 6 pills po for one day to 1 pill po the last day over 6 days, Disp: 21 tablet, Rfl: 0 ?  SYMBICORT 160-4.5 MCG/ACT inhaler, Inhale 2 puffs into the lungs as needed., Disp: , Rfl:  ?  traMADol (ULTRAM) 50 MG tablet, One po bid prn, Disp: 60 tablet, Rfl: 5 ?  zolpidem (AMBIEN) 5 MG tablet, TAKE 1 TABLET(5 MG) BY MOUTH AT BEDTIME AS NEEDED FOR SLEEP, Disp: 90 tablet, Rfl: 1 ? ?PAST MEDICAL HISTORY: ?No past medical history on file. ? ?PAST SURGICAL HISTORY: ?Past Surgical History:  ?Procedure Laterality Date  ? TOOTH EXTRACTION  05/07/2016  ? ? ?FAMILY HISTORY: ?No family history on file. ? ?SOCIAL HISTORY: ? ?Social History  ? ?Socioeconomic History  ? Marital status: Married  ?  Spouse name: Not on file  ? Number of children: Not on file  ? Years of education: Not on file  ? Highest education level: Not on file  ?Occupational History  ? Occupation: [employer]-works from home  ?Tobacco Use  ? Smoking status: Never  ? Smokeless tobacco: Never  ?Substance and Sexual Activity  ? Alcohol use: Never  ? Drug use: Never   ? Sexual activity: Not on file  ?Other Topics Concern  ? Not on file  ?Social History Narrative  ? Lives with spouse and stepdaughters.  ? Caffeine use:   ? Starbucks frap 1-2 per week  ? Right handed   ? ?Social Determinants of Health  ? ?Financial Resource Strain: Not on file  ?Food Insecurity: Not on file  ?Transportation Needs: Not on file  ?Physical Activity: Not on file  ?Stress: Not on file  ?Social Connections: Not on file  ?Intimate Partner Violence: Not on file  ? ? ? ?PHYSICAL EXAM ? ?Vitals:  ? 09/10/21 1521  ?BP: 122/77  ?Pulse: (!) 57  ?Weight: 241 lb (109.3 kg)  ?Height: 5\' 8"  (1.727 m)  ? ? ?Body mass index is 36.64 kg/m?. ? ? ?General: The patient is well-developed and well-nourished  and in no acute distress ? ?HEENT:  Head is [place]/AT.  Sclera are anicteric.  ? ?Neurologic Exam ? ?Mental status: The patient is alert and oriented x 3 at the time of the examination. The patient has apparent normal recent and remote memory, with an apparently normal attention span and concentration ability.   Speech is normal. ? ?Cranial nerves: Extraocular movements are full.   Colors are symmetric.    Facial symmetry is present. Facial strength is normal.  Trapezius and sternocleidomastoid strength is normal. No dysarthria is noted.   No obvious hearing deficits are noted. ? ?Motor:  Muscle bulk is normal.   Tone is normal. Strength is  Now  5/5 in both arms and legs.   ? ?Sensory: She has reduced touch/temperature in the right hand .   She has reduced vibration sensation in the right arm .   Legs now symmetric ? ?Coordination: Cerebellar testing reveals good left but mildly reduced right finger-nose-finger and reduced left heel-to-shin . ? ?Gait and station: Station is normal.  Gait was fairly normal.  Tandem gait is mildly wide.. Romberg is negative.  ? ?Reflexes: Deep tendon reflexes are symmetric and normal bilaterally.    ? ? ? ? ? ?ASSESSMENT AND PLAN ? ?Multiple sclerosis (HCC) - Plan: MR BRAIN W WO CONTRAST,  MR CERVICAL SPINE W WO CONTRAST, CBC with Differential/Platelet, Hepatic function panel ? ?Neck pain - Plan: MR CERVICAL SPINE W WO CONTRAST ? ?Numbness - Plan: MR BRAIN W WO CONTRAST ? ?High risk medicati

## 2021-09-11 ENCOUNTER — Ambulatory Visit: Payer: No Typology Code available for payment source | Admitting: Neurology

## 2021-09-11 LAB — HEPATIC FUNCTION PANEL
ALT: 9 IU/L (ref 0–32)
AST: 18 IU/L (ref 0–40)
Albumin: 4.4 g/dL (ref 3.8–4.8)
Alkaline Phosphatase: 65 IU/L (ref 44–121)
Bilirubin Total: 0.5 mg/dL (ref 0.0–1.2)
Bilirubin, Direct: 0.16 mg/dL (ref 0.00–0.40)
Total Protein: 7.2 g/dL (ref 6.0–8.5)

## 2021-09-11 LAB — CBC WITH DIFFERENTIAL/PLATELET
Basophils Absolute: 0 10*3/uL (ref 0.0–0.2)
Basos: 0 %
EOS (ABSOLUTE): 0.1 10*3/uL (ref 0.0–0.4)
Eos: 1 %
Hematocrit: 35.3 % (ref 34.0–46.6)
Hemoglobin: 11.2 g/dL (ref 11.1–15.9)
Immature Grans (Abs): 0 10*3/uL (ref 0.0–0.1)
Immature Granulocytes: 0 %
Lymphocytes Absolute: 2.4 10*3/uL (ref 0.7–3.1)
Lymphs: 37 %
MCH: 26.6 pg (ref 26.6–33.0)
MCHC: 31.7 g/dL (ref 31.5–35.7)
MCV: 84 fL (ref 79–97)
Monocytes Absolute: 0.5 10*3/uL (ref 0.1–0.9)
Monocytes: 8 %
Neutrophils Absolute: 3.3 10*3/uL (ref 1.4–7.0)
Neutrophils: 54 %
Platelets: 418 10*3/uL (ref 150–450)
RBC: 4.21 x10E6/uL (ref 3.77–5.28)
RDW: 13.7 % (ref 11.7–15.4)
WBC: 6.3 10*3/uL (ref 3.4–10.8)

## 2021-09-17 ENCOUNTER — Telehealth: Payer: Self-pay | Admitting: Neurology

## 2021-09-17 NOTE — Telephone Encounter (Signed)
[employer] Bind auth: [place] Ref # [id number] order sent to GI, they will reach out to the patient to schedule.  ?

## 2021-09-22 NOTE — Telephone Encounter (Signed)
Patient left a voicemail on my phone stating she would like to go to [facility] ortho ph # [phone] & fax # [phone].. I faxed the order they will reach out to the patient to schedule.  ?

## 2021-10-04 IMAGING — RF DG UGI W SINGLE CM
3 series · 15 of 22 positions shown · non-contrast
Comparison: None.

CLINICAL DATA: Morbid obesity. Pre-op evaluation for bariatric
surgery.

EXAM:
UPPER GI SERIES WITH KUB
TECHNIQUE: After obtaining a scout radiograph a routine upper GI series was
performed using thin barium.
FLUOROSCOPY TIME:  Fluoroscopy Time:  2 minutes 54 seconds
Radiation Exposure Index (if provided by the fluoroscopic device):
89 mGy
Number of Acquired Spot Images: 0

[Series 1: one shot · 0.14mm/px · 5 of 8 slices shown (1 of 2)]
[im 1/8]
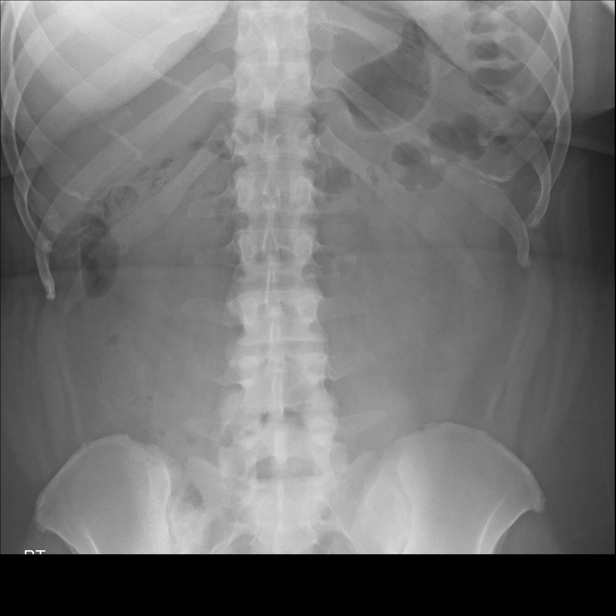
[im 3/8]
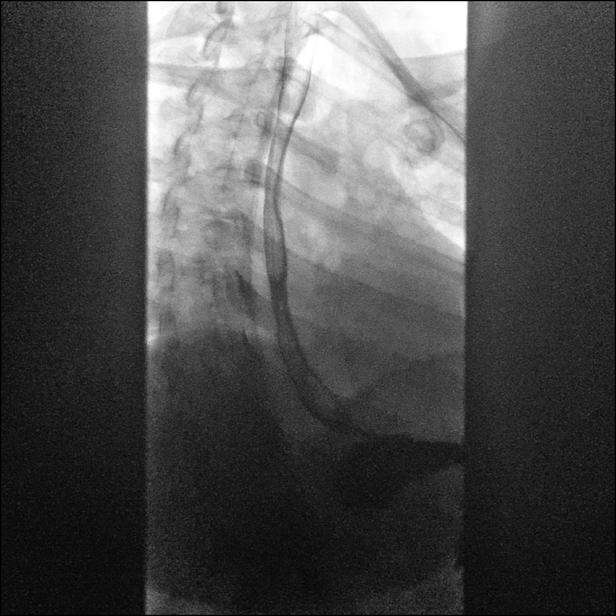
[im 4/8]
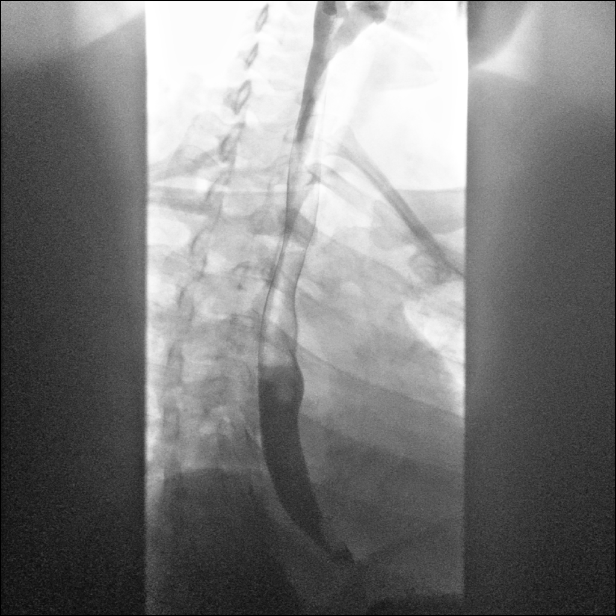
[im 6/8]
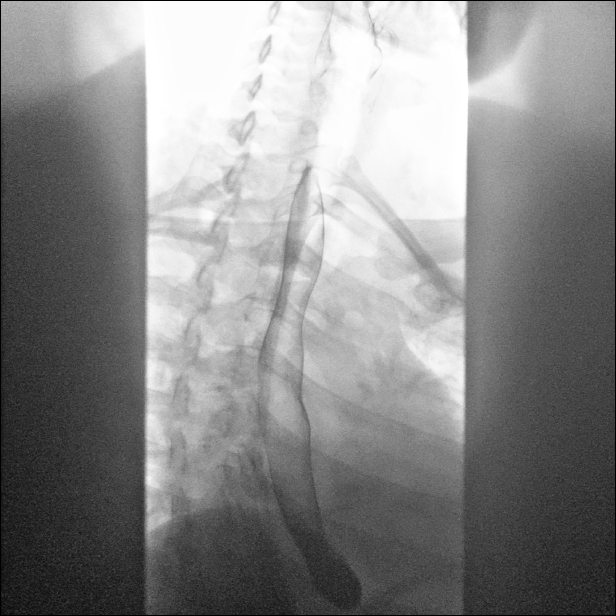
[im 7/8]
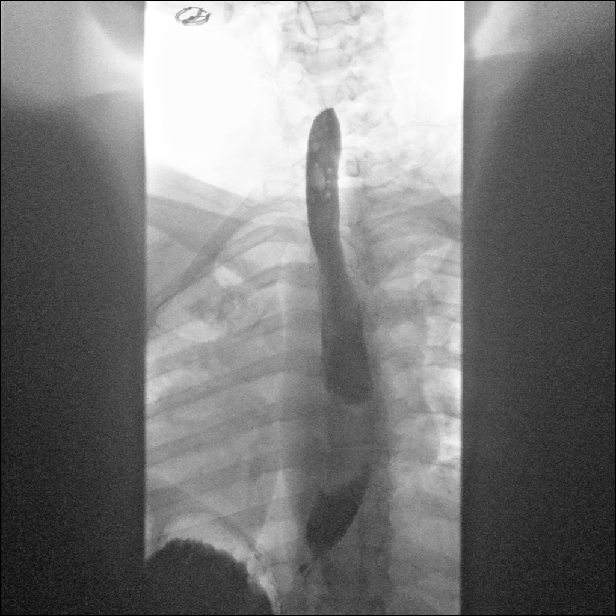

[Series 2: sequence · 3 of 24 frames shown]
[frame 4/24]
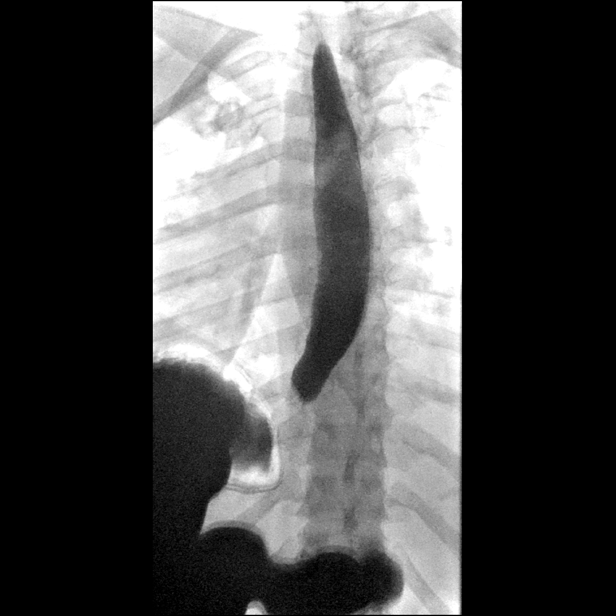
[frame 7/24]
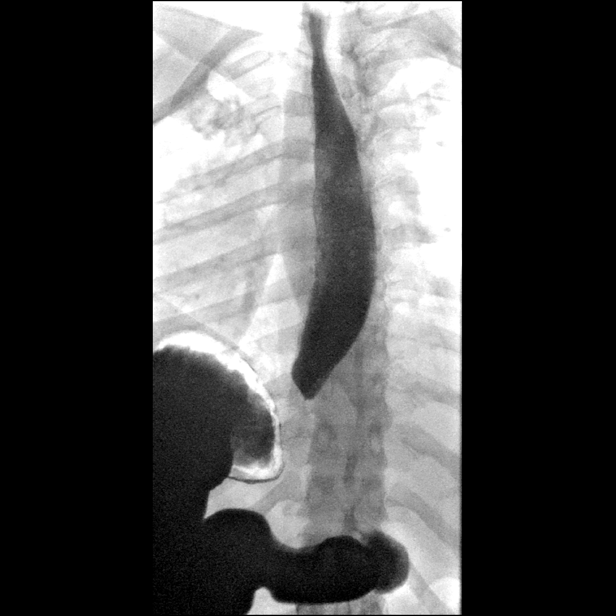
[frame 21/24]
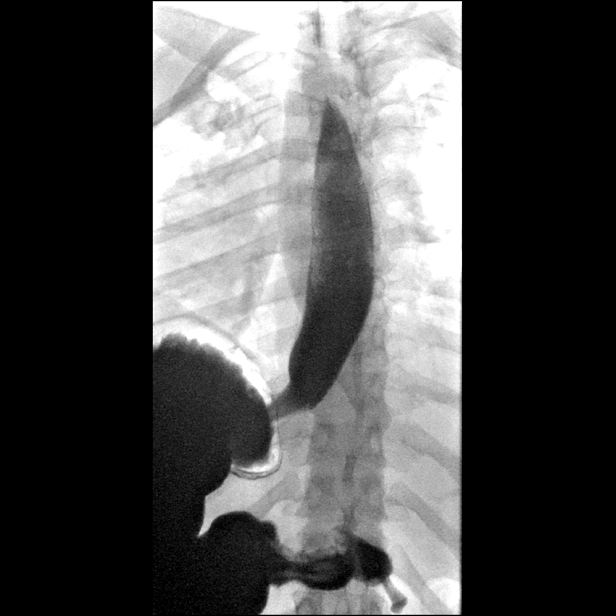

[Series 3: one shot · 7 of 10 slices shown (2 of 2)]
[im 1/10]
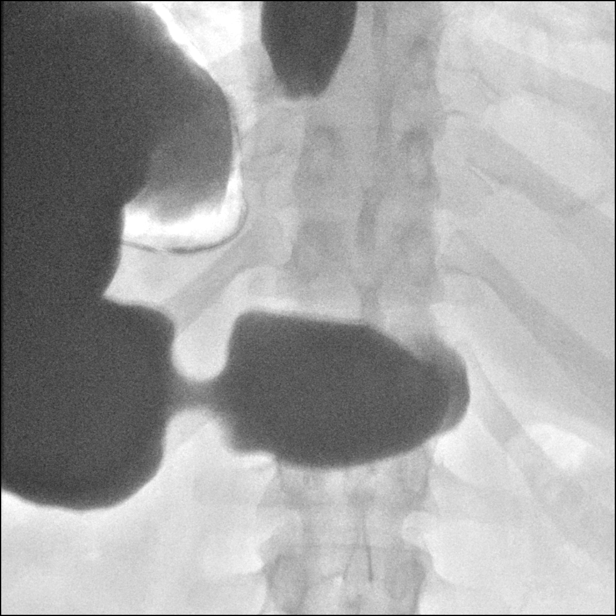
[im 2/10]
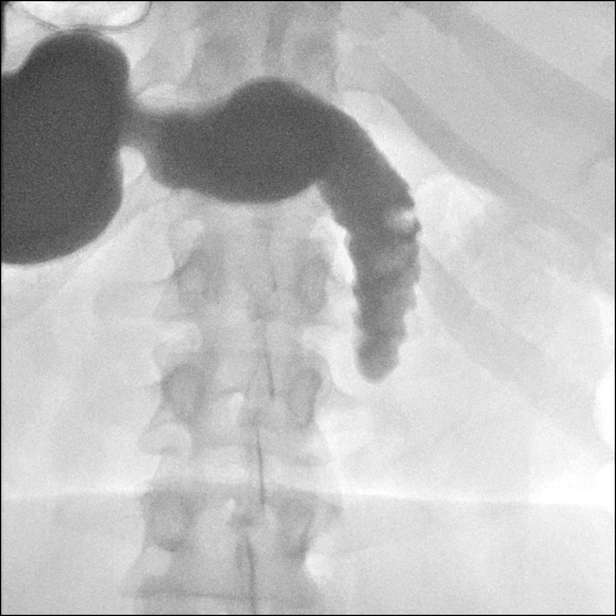
[im 4/10]
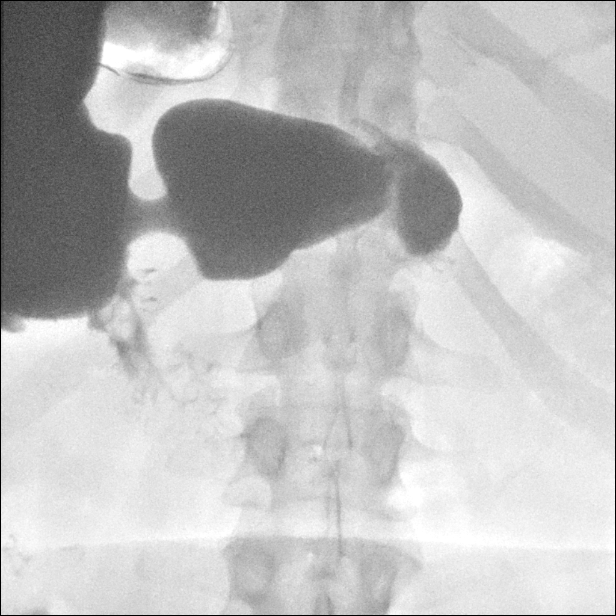
[im 5/10]
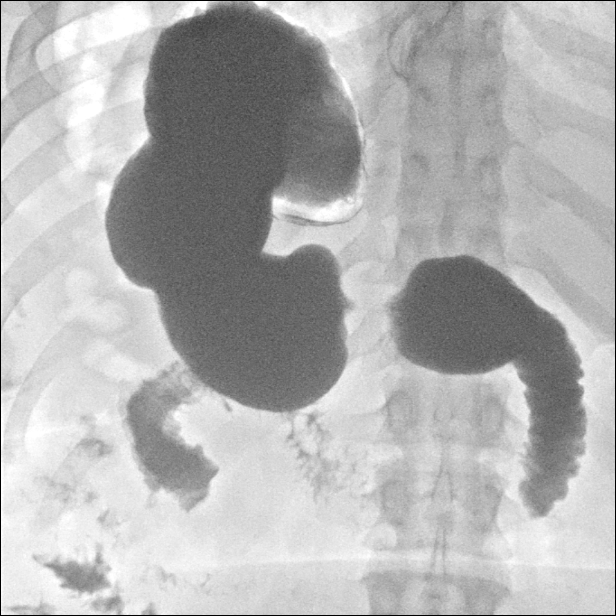
[im 7/10]
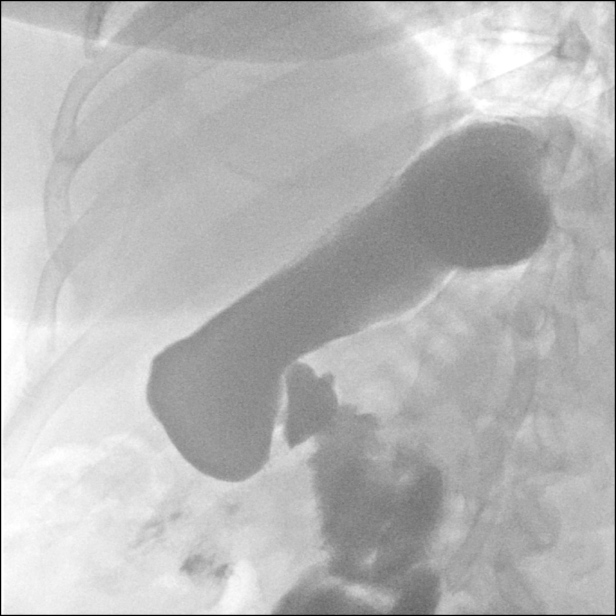
[im 8/10]
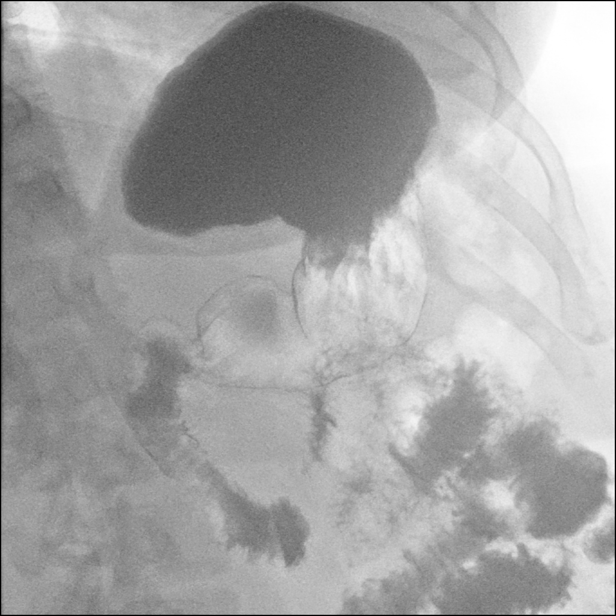
[im 10/10]
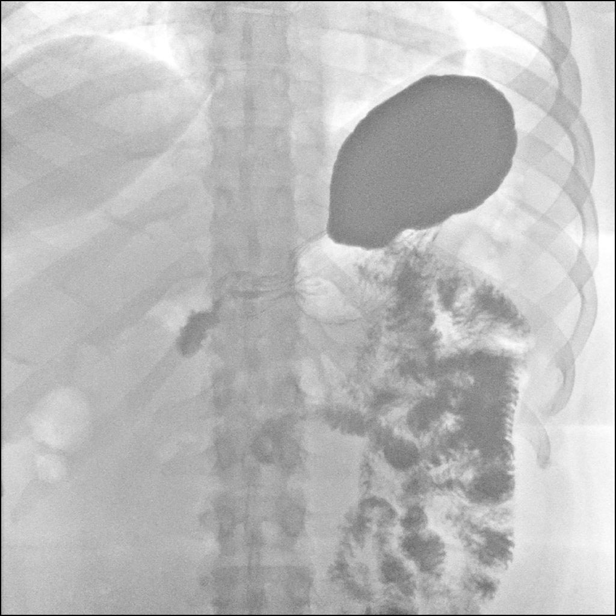

[15 of 22 positions shown; findings below may reference images not displayed]

FINDINGS: Scout radiograph:  Unremarkable bowel gas pattern.

Esophagus: No evidence of esophageal mass or stricture. Motility is
within normal limits. No gastroesophageal reflux observed.

Stomach: No hiatal hernia visualized. Normal size and appearance. No
evidence of gastric mass or ulcer.

Duodenum: Normal appearance of duodenal bulb and sweep. Normal
position of ligament of Treitz. No ulcer, stricture, or other
significant abnormality seen.

Other:  None.
IMPRESSION: Normal upper GI series.

## 2021-10-10 ENCOUNTER — Other Ambulatory Visit: Payer: No Typology Code available for payment source

## 2021-11-02 ENCOUNTER — Encounter: Payer: Self-pay | Admitting: Neurology

## 2021-11-05 NOTE — Telephone Encounter (Signed)
  The MRI of the brain and cervical spine performed 10/10/2021 was personally reviewed.  The MRI of the brain was compared to MRI of 05/25/2020.  The brain shows multiple T2/FLAIR hyperintense foci in the periventricular, juxtacortical and deep white matter.  A focus is also noted in the pons.  None of the foci enhance or appear to be acute.  Compared to the MRI from 2022, there are no new lesions.  The MRI of the cervical spine shows 2 T2 hyperintense foci, 1 adjacent to C2 posterolaterally to the right and and other centrally at C3-C4.  We do not have comparison cervical spine images.  However, it appears as though both of these were present on sagittal FLAIR images from the previous brain MRIs.  There was subtle T1 hyperintensity that could be artifact or enhancement of the C3-C4 focus.  As the focus appears to be present on MRI of the brain from 05/25/2020 and 05/24/2019, this favors artifact.

## 2022-01-26 ENCOUNTER — Other Ambulatory Visit: Payer: Self-pay | Admitting: Neurology

## 2022-01-28 ENCOUNTER — Encounter: Payer: Self-pay | Admitting: Neurology

## 2022-01-29 ENCOUNTER — Other Ambulatory Visit: Payer: Self-pay | Admitting: Neurology

## 2022-01-29 MED ORDER — CYCLOBENZAPRINE HCL 5 MG PO TABS: 5.0000 mg | ORAL_TABLET | Freq: Three times a day (TID) | ORAL | 0 refills | Status: DC | PRN

## 2022-03-11 ENCOUNTER — Other Ambulatory Visit: Payer: Self-pay | Admitting: Neurology

## 2022-03-18 ENCOUNTER — Encounter: Payer: Self-pay | Admitting: Neurology

## 2022-03-18 ENCOUNTER — Ambulatory Visit (INDEPENDENT_AMBULATORY_CARE_PROVIDER_SITE_OTHER): Payer: No Typology Code available for payment source | Admitting: Neurology

## 2022-03-18 VITALS — BP 126/78 | HR 88 | Ht 67.0 in | Wt 229.0 lb

## 2022-03-18 DIAGNOSIS — G35 Multiple sclerosis: Secondary | ICD-10-CM

## 2022-03-18 DIAGNOSIS — M542 Cervicalgia: Secondary | ICD-10-CM | POA: Diagnosis not present

## 2022-03-18 DIAGNOSIS — Z79899 Other long term (current) drug therapy: Secondary | ICD-10-CM | POA: Diagnosis not present

## 2022-03-18 DIAGNOSIS — R200 Anesthesia of skin: Secondary | ICD-10-CM

## 2022-03-18 MED ORDER — CYCLOBENZAPRINE HCL 5 MG PO TABS: 5.0000 mg | ORAL_TABLET | Freq: Three times a day (TID) | ORAL | 5 refills | Status: DC | PRN

## 2022-03-18 MED ORDER — GABAPENTIN 300 MG PO CAPS: 300.0000 mg | ORAL_CAPSULE | Freq: Two times a day (BID) | ORAL | 3 refills | Status: DC

## 2022-03-18 NOTE — Progress Notes (Signed)
[facility]  PATIENT: Tanya Fitzpatrick DOB: 1981/04/29  REFERRING DOCTOR OR PCP: Melissa Montane, MD SOURCE: Patient, notes from primary care, imaging and lab reports, MRI images personally reviewed.  _________________________________   HISTORICAL  CHIEF COMPLAINT:  Chief Complaint  Patient presents with   Follow-up    Pt in room #1 and alone. Pt here today for f/u on Bafiertam for MS.    HISTORY OF PRESENT ILLNESS:  Tanya Fitzpatrick is a 41 year old woman with relapsing remitting multiple sclerosis.  Update 03/18/2022: She is on Bafiertam (MMF) as a DMT for her MS.   She denies exacerbation.     MRI's looked good -- no new lesions:  MRI of the brain 10/10/2021 shows multiple T2/FLAIR hyperintense foci in the periventricular, juxtacortical and deep white matter.  A focus is also noted in the pons.  None of the foci enhance or appear to be acute.  Compared to the MRI from 2022, there are no new lesions.  MRI cervical spine 10/10/2021 shows 2 T2 hyperintense foci, 1 adjacent to C2 posterolaterally to the right and and other centrally at C3-C4.  We do not have comparison cervical spine images.  However, it appears as though both of these were present on sagittal FLAIR images from the previous brain MRIs.  There was subtle T1 hyperintensity that could be artifact or enhancement of the C3-C4 focus.  As the focus appears to be present on MRI of the brain from 05/25/2020 and 05/24/2019, this favors artifact. Mild left foraminal narrowing at C4C5 (disc/osteophyte)  She continues to have pain in her neck and shoulder bilaterally  Pain does not go further into the arms.  The cyclobenzaprine up to tid is helping her a lot.    Sometimes extended driving or some household tasks exacerbate her pain.      Gait is doing well and no falls.    She sometimes holds the rail on the stairs.  She walks 3 miles a day (was doing more in better weather).     Right arm numbness and pain  fluctuates but was worse during last exacerbation     She feels the right hand grip is back to normal.   She has urinary urgency but not incontinence.   Vision is stable.  No color asymmetry.     She notes LBP is better with weight loss.  She has lost 80 pounds with diet initially and then gastric sleeve procedure.      Weight is now stabilized 90 pounds below max.    Gabapentin and tramadol has helped the pain.     She has mild fatigue.  She is sometimes sleepy.  She does not snore..  She sleeps well most nights on Ambien 5 mg.  Mood has done well recently.   She feels cognition is stable (occ. Forgetful)  MS HISTORY: She was diagnosed with MS 05/2019.      In February 2020, she had numbness in the chest bilaterally and in the right arm.   Over the next month, the numbness resolved in the chest but continued in the right arm.  She reports that an MRI had been scheduled but due to COVID-19 and changes in schedules, she never did the study.     She had Covid-19 in mid November 2020 and she noted more headaches but did not require hospitalization.  She noted numbness in the left hand that started in early December.  MRI 05/24/2019 was c/w MS and she was referred.  She was started on Bafiertam (MMF) February 2021.    IMAGING MRI of the brain (without contrast) 05/24/2019 shows multiple T2/FLAIR hyperintense foci in the periventricular, juxtacortical and deep white matter.  Additionally there are some foci in the cerebellum and brainstem.  The pattern is very consistent with multiple sclerosis.  Some foci are hyperintense on diffusion-weighted images but it is uncertain if these represent more acute lesions or shine through.  MRI of the brain 05/25/2020 showed a couple T2/FLAIR hyperintense foci in the hemispheres and left pons in a pattern and configuration consistent with chronic demyelinating plaque associated with multiple sclerosis.  None of the foci appear to be acute.  They do not enhance.  Compared to the  MRI from 05/24/2019, there are no new lesions.       Normal enhancement pattern.  No acute findings  MRI of the brain 10/10/2021 shows multiple T2/FLAIR hyperintense foci in the periventricular, juxtacortical and deep white matter.  A focus is also noted in the pons.  None of the foci enhance or appear to be acute.  Compared to the MRI from 2022, there are no new lesions.  MRI cervical spine 10/10/2021 shows 2 T2 hyperintense foci, 1 adjacent to C2 posterolaterally to the right and and other centrally at C3-C4.  We do not have comparison cervical spine images.  However, it appears as though both of these were present on sagittal FLAIR images from the previous brain MRIs.  There was subtle T1 hyperintensity that could be artifact or enhancement of the C3-C4 focus.  As the focus appears to be present on MRI of the brain from 05/25/2020 and 05/24/2019, this favors artifact.  FH: She has one first cousin and one second cousin with MS.  One diagnosed in early 48's and one around age 28.     REVIEW OF SYSTEMS: Constitutional: No fevers, chills, sweats, or change in appetite.  She notes fatigue.  She has some insomnia at times. Eyes: No visual changes, double vision, eye pain Ear, nose and throat: No hearing loss, ear pain, nasal congestion, sore throat Cardiovascular: No chest pain, palpitations Respiratory:  No shortness of breath at rest or with exertion.   No wheezes GastrointestinaI: No nausea, vomiting, diarrhea, abdominal pain, fecal incontinence Genitourinary: She has urinary frequency.. Musculoskeletal: As above. Integumentary: No rash, pruritus, skin lesions Neurological: as above Psychiatric: No depression at this time.  No anxiety Endocrine: No palpitations, diaphoresis, change in appetite, change in weigh or increased thirst Hematologic/Lymphatic:  No anemia, purpura, petechiae. Allergic/Immunologic: No itchy/runny eyes, nasal congestion, recent allergic reactions, rashes  ALLERGIES: No Known  Allergies  HOME MEDICATIONS:  Current Outpatient Medications:    Acetaminophen (ARTHRITIS PAIN PO), Take 2-4 tablets by mouth daily., Disp: , Rfl:    albuterol (PROVENTIL HFA;VENTOLIN HFA) 108 (90 Base) MCG/ACT inhaler, Inhale into the lungs., Disp: , Rfl:    BAFIERTAM 95 MG CPDR, TAKE 2 CAPSULES (190MG ) BY  MOUTH TWICE DAILY, Disp: 120 capsule, Rfl: 11   fluticasone (FLONASE) 50 MCG/ACT nasal spray, SPRAY 2 SPRAYS INTO BOTH NOSTRILS DAILY, Disp: , Rfl:    Loratadine (CLARITIN REDITABS PO), Take by mouth., Disp: , Rfl:    methylPREDNISolone (MEDROL) 4 MG tablet, Taper from 6 pills po for one day to 1 pill po the last day over 6 days, Disp: 21 tablet, Rfl: 0   SYMBICORT 160-4.5 MCG/ACT inhaler, Inhale 2 puffs into the lungs as needed., Disp: , Rfl:    traMADol (ULTRAM) 50 MG tablet, TAKE 1 TABLET  BY MOUTH TWICE DAILY AS NEEDED, Disp: 60 tablet, Rfl: 5   zolpidem (AMBIEN) 5 MG tablet, TAKE 1 TABLET(5 MG) BY MOUTH AT BEDTIME AS NEEDED FOR SLEEP, Disp: 90 tablet, Rfl: 1   cyclobenzaprine (FLEXERIL) 5 MG tablet, Take 1 tablet (5 mg total) by mouth 3 (three) times daily as needed for muscle spasms., Disp: 90 tablet, Rfl: 5   gabapentin (NEURONTIN) 300 MG capsule, Take 1 capsule (300 mg total) by mouth 2 (two) times daily., Disp: 180 capsule, Rfl: 3  PAST MEDICAL HISTORY: History reviewed. No pertinent past medical history.  PAST SURGICAL HISTORY: Past Surgical History:  Procedure Laterality Date   TOOTH EXTRACTION  05/07/2016    FAMILY HISTORY: History reviewed. No pertinent family history.  SOCIAL HISTORY:  Social History   Socioeconomic History   Marital status: Married    Spouse name: Not on file   Number of children: Not on file   Years of education: Not on file   Highest education level: Not on file  Occupational History   Occupation: [place] from home  Tobacco Use   Smoking status: Never   Smokeless tobacco: Never  Substance and Sexual Activity   Alcohol  use: Never   Drug use: Never   Sexual activity: Not on file  Other Topics Concern   Not on file  Social History Narrative   Lives with spouse and stepdaughters.   Caffeine use:    Starbucks frap 1-2 per week   Right handed    Social Determinants of Health   Financial Resource Strain: Not on file  Food Insecurity: Not on file  Transportation Needs: Not on file  Physical Activity: Not on file  Stress: Not on file  Social Connections: Not on file  Intimate Partner Violence: Not on file     PHYSICAL EXAM  Vitals:   03/18/22 0816  BP: 126/78  Pulse: 88  Weight: 229 lb (103.9 kg)  Height: 5\' 7"  (1.702 m)    Body mass index is 35.87 kg/m.   General: The patient is well-developed and well-nourished and in no acute distress  HEENT:  Head is [place]/AT.  Sclera are anicteric.   Neurologic Exam  Mental status: The patient is alert and oriented x 3 at the time of the examination. The patient has apparent normal recent and remote memory, with an apparently normal attention span and concentration ability.   Speech is normal.  Cranial nerves: Extraocular movements are full.   Colors are symmetric.    Facial symmetry is present. Facial strength is normal.  Trapezius and sternocleidomastoid strength is normal. No dysarthria is noted.   No obvious hearing deficits are noted.  Motor:  Muscle bulk is normal.   Tone is normal. Strength is  Now  5/5 in both arms and legs.    Sensory: She has reduced touch/temperature in the right hand .   She has reduced vibration sensation in the right arm .   Legs now symmetric  Coordination: Cerebellar testing reveals good left but mildly reduced right finger-nose-finger and reduced left heel-to-shin .  Gait and station: Station is normal.  Gait was normal.  Tandem gait was wide... Romberg is negative.   Reflexes: Deep tendon reflexes are symmetric and normal bilaterally.         ASSESSMENT AND PLAN  Multiple sclerosis ([place]) - Plan: CBC with  Differential/Platelet, Comprehensive metabolic panel  High risk medication use - Plan: CBC with Differential/Platelet, Comprehensive metabolic panel  Neck pain  Numbness  1.   Continue Bafiertam (monomethyl fumarate).  We will check CBC with differential and CMP today.  2.   Stay active and exercise as tolerated.. 3.   Continue gabapentin and cyclobenzaprine  4.   She will return to see me in 6 months or sooner if there are new or worsening neurologic symptoms.  Loyce Flaming A. Felecia Shelling, MD, [facility] [id number], [id number] PM Certified in Neurology, Clinical Neurophysiology, Sleep Medicine and Neuroimaging  [facility] [address], [place], [place] [address] [phone]

## 2022-03-19 LAB — COMPREHENSIVE METABOLIC PANEL
ALT: 8 IU/L (ref 0–32)
AST: 12 IU/L (ref 0–40)
Albumin/Globulin Ratio: 1.5 (ref 1.2–2.2)
Albumin: 4.4 g/dL (ref 3.9–4.9)
Alkaline Phosphatase: 66 IU/L (ref 44–121)
BUN/Creatinine Ratio: 12 (ref 9–23)
BUN: 10 mg/dL (ref 6–24)
Bilirubin Total: 0.6 mg/dL (ref 0.0–1.2)
CO2: 25 mmol/L (ref 20–29)
Calcium: 9.6 mg/dL (ref 8.7–10.2)
Chloride: 103 mmol/L (ref 96–106)
Creatinine, Ser: 0.81 mg/dL (ref 0.57–1.00)
Globulin, Total: 3 g/dL (ref 1.5–4.5)
Glucose: 103 mg/dL — ABNORMAL HIGH (ref 70–99)
Potassium: 4.6 mmol/L (ref 3.5–5.2)
Sodium: 142 mmol/L (ref 134–144)
Total Protein: 7.4 g/dL (ref 6.0–8.5)
eGFR: 94 mL/min/{1.73_m2} (ref >59)

## 2022-03-19 LAB — CBC WITH DIFFERENTIAL/PLATELET
Basophils Absolute: 0 10*3/uL (ref 0.0–0.2)
Basos: 0 %
EOS (ABSOLUTE): 0.1 10*3/uL (ref 0.0–0.4)
Eos: 2 %
Hematocrit: 35.1 % (ref 34.0–46.6)
Hemoglobin: 11.4 g/dL (ref 11.1–15.9)
Immature Grans (Abs): 0 10*3/uL (ref 0.0–0.1)
Immature Granulocytes: 0 %
Lymphocytes Absolute: 2.2 10*3/uL (ref 0.7–3.1)
Lymphs: 37 %
MCH: 26.1 pg — ABNORMAL LOW (ref 26.6–33.0)
MCHC: 32.5 g/dL (ref 31.5–35.7)
MCV: 81 fL (ref 79–97)
Monocytes Absolute: 0.4 10*3/uL (ref 0.1–0.9)
Monocytes: 7 %
Neutrophils Absolute: 3.1 10*3/uL (ref 1.4–7.0)
Neutrophils: 54 %
Platelets: 424 10*3/uL (ref 150–450)
RBC: 4.36 x10E6/uL (ref 3.77–5.28)
RDW: 13.7 % (ref 11.7–15.4)
WBC: 5.9 10*3/uL (ref 3.4–10.8)

## 2022-05-20 ENCOUNTER — Other Ambulatory Visit: Payer: Self-pay

## 2022-05-20 MED ORDER — BAFIERTAM 95 MG PO CPDR: DELAYED_RELEASE_CAPSULE | ORAL | 4 refills | Status: DC

## 2022-06-24 ENCOUNTER — Other Ambulatory Visit: Payer: Self-pay | Admitting: *Deleted

## 2022-06-24 MED ORDER — CYCLOBENZAPRINE HCL 5 MG PO TABS: 5.0000 mg | ORAL_TABLET | Freq: Three times a day (TID) | ORAL | 1 refills | Status: DC | PRN

## 2022-06-24 MED ORDER — GABAPENTIN 300 MG PO CAPS: 300.0000 mg | ORAL_CAPSULE | Freq: Two times a day (BID) | ORAL | 1 refills | Status: DC

## 2022-08-11 ENCOUNTER — Telehealth: Payer: Self-pay | Admitting: *Deleted

## 2022-08-11 NOTE — Telephone Encounter (Signed)
   Prior Auth team please route replies to Pod 1

## 2022-08-12 ENCOUNTER — Other Ambulatory Visit (HOSPITAL_COMMUNITY): Payer: Self-pay

## 2022-08-12 NOTE — Telephone Encounter (Signed)
Pharmacy Patient Advocate Encounter   Received notification from Caroleen that prior authorization for [employer] is required/requested.   PA submitted on 08/12/2022 to (ins) Bafiertam 95MG  dr capsules via CoverMyMeds Key or (Medicaid) confirmation # [id number] Status is pending

## 2022-08-12 NOTE — Telephone Encounter (Signed)
Mychart message sent to patient informing her

## 2022-08-12 NOTE — Telephone Encounter (Signed)
Pharmacy Patient Advocate Encounter  Prior Authorization for Bafiertam 95MG  dr capsules has been approved by [employer] (ins).    PA # PA Case ID #: [id number] Effective dates: 08/12/2022 through 08/12/2023

## 2022-08-28 ENCOUNTER — Encounter: Payer: Self-pay | Admitting: Neurology

## 2022-08-31 ENCOUNTER — Other Ambulatory Visit: Payer: Self-pay

## 2022-08-31 MED ORDER — TRAMADOL HCL 50 MG PO TABS: ORAL_TABLET | ORAL | 2 refills | Status: DC

## 2022-08-31 MED ORDER — ZOLPIDEM TARTRATE 5 MG PO TABS: ORAL_TABLET | ORAL | 1 refills | Status: DC

## 2022-08-31 MED ORDER — TRAMADOL HCL 50 MG PO TABS: ORAL_TABLET | ORAL | 1 refills | Status: DC

## 2022-09-09 MED ORDER — TRAMADOL HCL 50 MG PO TABS: ORAL_TABLET | ORAL | 2 refills | Status: DC

## 2022-09-09 NOTE — Addendum Note (Signed)
Addended by: Despina Arias A on: 09/09/2022 12:42 PM   Modules accepted: Orders

## 2022-09-09 NOTE — Addendum Note (Signed)
Addended by: Arther Abbott on: 09/09/2022 07:39 AM   Modules accepted: Orders

## 2022-10-01 ENCOUNTER — Other Ambulatory Visit: Payer: Self-pay | Admitting: *Deleted

## 2022-10-01 MED ORDER — BAFIERTAM 95 MG PO CPDR: DELAYED_RELEASE_CAPSULE | ORAL | 4 refills | Status: DC

## 2022-10-08 NOTE — Patient Instructions (Signed)

## 2022-10-08 NOTE — Progress Notes (Signed)
Chief Complaint  Patient presents with   Follow-up    RM 2, alone.  Last seen 03/18/22. MS DMT: Paulla Dolly. Tolerating well and doing well.  Has boil under armpit. Started in last yr or so. Had to have procedure to have this removed. Will develop other things in other areas. Wondering if MS med is causing this.     HISTORY OF PRESENT ILLNESS:  10/14/22 ALL:  Tanya Fitzpatrick is a 42 y.o. female here today for follow up for RRMS. She continues Bafiertam 190mg  BID. Last MRI brian and cervical spine stable 09/2021. Labs have been stable.   She feels that she is doing well. No new or exacerbating symptoms. Gait stable. She continues to have waxing and waning right shoulder and arm pain. Cyclobenzaprine 5mg  as needed helps with pain and insomnia. She continues gabapentin 300mg  BID. She continues Tramadol 50mg  BID. PDMP appropriate.   She feels that she is sleeping well on Zolpidem 5mg . She tries not to take it every day. Brain fog and short term memory are significantly improved. She has tried to be more active.   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 She has lost about 80lbs. She continues vitamin d and B12 supplements. She was recently treated for axillary boil with I&D. She has had other smaller places over the past few months.   HISTORY (copied from Dr Bonnita Hollow previous note)  Tanya Fitzpatrick is a 42 year old woman with relapsing remitting multiple sclerosis.   Update 03/18/2022: She is on Bafiertam (MMF) as a DMT for her MS.   She denies exacerbation.      MRI's looked good -- no new lesions:   MRI of the brain 10/10/2021 shows multiple T2/FLAIR hyperintense foci in the periventricular, juxtacortical and deep white matter.  A focus is also noted in the pons.  None of the foci enhance or appear to be acute.  Compared to the MRI from 2022, there are no new lesions.   MRI cervical spine 10/10/2021 shows 2 T2 hyperintense foci, 1 adjacent to C2 posterolaterally to the right and and other  centrally at C3-C4.  We do not have comparison cervical spine images.  However, it appears as though both of these were present on sagittal FLAIR images from the previous brain MRIs.  There was subtle T1 hyperintensity that could be artifact or enhancement of the C3-C4 focus.  As the focus appears to be present on MRI of the brain from 05/25/2020 and 05/24/2019, this favors artifact. Mild left foraminal narrowing at C4C5 (disc/osteophyte)   She continues to have pain in her neck and shoulder bilaterally  Pain does not go further into the arms.  The cyclobenzaprine up to tid is helping her a lot.    Sometimes extended driving or some household tasks exacerbate her pain.       Gait is doing well and no falls.    She sometimes holds the rail on the stairs.  She walks 3 miles a day (was doing more in better weather).     Right arm numbness and pain fluctuates but was worse during last exacerbation     She feels the right hand grip is back to normal.   She has urinary urgency but not incontinence.   Vision is stable.  No color asymmetry.      She notes LBP is better with weight loss.  She has lost 80 pounds with diet initially and then gastric sleeve procedure.      Weight is now  stabilized 90 pounds below max.     Gabapentin and tramadol has helped the pain.      She has mild fatigue.  She is sometimes sleepy.  She does not snore..  She sleeps well most nights on Ambien 5 mg.  Mood has done well recently.   She feels cognition is stable (occ. Forgetful)   MS HISTORY: She was diagnosed with MS 05/2019.      In February 2020, she had numbness in the chest bilaterally and in the right arm.   Over the next month, the numbness resolved in the chest but continued in the right arm.  She reports that an MRI had been scheduled but due to COVID-19 and changes in schedules, she never did the study.     She had Covid-19 in mid November 2020 and she noted more headaches but did not require hospitalization.  She noted  numbness in the left hand that started in early December.  MRI 05/24/2019 was c/w MS and she was referred.  She was started on Bafiertam (MMF) February 2021.     IMAGING MRI of the brain (without contrast) 05/24/2019 shows multiple T2/FLAIR hyperintense foci in the periventricular, juxtacortical and deep white matter.  Additionally there are some foci in the cerebellum and brainstem.  The pattern is very consistent with multiple sclerosis.  Some foci are hyperintense on diffusion-weighted images but it is uncertain if these represent more acute lesions or shine through.   MRI of the brain 05/25/2020 showed a couple T2/FLAIR hyperintense foci in the hemispheres and left pons in a pattern and configuration consistent with chronic demyelinating plaque associated with multiple sclerosis.  None of the foci appear to be acute.  They do not enhance.  Compared to the MRI from 05/24/2019, there are no new lesions.       Normal enhancement pattern.  No acute findings   MRI of the brain 10/10/2021 shows multiple T2/FLAIR hyperintense foci in the periventricular, juxtacortical and deep white matter.  A focus is also noted in the pons.  None of the foci enhance or appear to be acute.  Compared to the MRI from 2022, there are no new lesions.   MRI cervical spine 10/10/2021 shows 2 T2 hyperintense foci, 1 adjacent to C2 posterolaterally to the right and and other centrally at C3-C4.  We do not have comparison cervical spine images.  However, it appears as though both of these were present on sagittal FLAIR images from the previous brain MRIs.  There was subtle T1 hyperintensity that could be artifact or enhancement of the C3-C4 focus.  As the focus appears to be present on MRI of the brain from 05/25/2020 and 05/24/2019, this favors artifact.   FH: She has one first cousin and one second cousin with MS.  One diagnosed in early 72's and one around age 40.     REVIEW OF SYSTEMS: Out of a complete 14 system review of symptoms, the  patient complains only of the following symptoms, right arm pain, brain fog, anxiety, boils and all other reviewed systems are negative.   ALLERGIES: No Known Allergies   HOME MEDICATIONS: Outpatient Medications Prior to Visit  Medication Sig Dispense Refill   Acetaminophen (ARTHRITIS PAIN PO) Take 2-4 tablets by mouth daily.     albuterol (PROVENTIL HFA;VENTOLIN HFA) 108 (90 Base) MCG/ACT inhaler Inhale into the lungs.     BAFIERTAM 95 MG CPDR TAKE 2 CAPSULES (190MG ) BY  MOUTH TWICE DAILY 120 capsule 4   cyclobenzaprine (  FLEXERIL) 5 MG tablet Take 1 tablet (5 mg total) by mouth 3 (three) times daily as needed for muscle spasms. 270 tablet 1   fluticasone (FLONASE) 50 MCG/ACT nasal spray SPRAY 2 SPRAYS INTO BOTH NOSTRILS DAILY     gabapentin (NEURONTIN) 300 MG capsule Take 1 capsule (300 mg total) by mouth 2 (two) times daily. 180 capsule 1   Loratadine (CLARITIN REDITABS PO) Take by mouth.     traMADol (ULTRAM) 50 MG tablet TAKE 1 TABLET BY MOUTH TWICE DAILY AS NEEDED 60 tablet 2   zolpidem (AMBIEN) 5 MG tablet Take 1 Tablet (5mg ) by Mouth at Bedtime As Needed for Sleep 90 tablet 1   methylPREDNISolone (MEDROL) 4 MG tablet Taper from 6 pills po for one day to 1 pill po the last day over 6 days 21 tablet 0   SYMBICORT 160-4.5 MCG/ACT inhaler Inhale 2 puffs into the lungs as needed.     No facility-administered medications prior to visit.     PAST MEDICAL HISTORY: No past medical history on file.   PAST SURGICAL HISTORY: Past Surgical History:  Procedure Laterality Date   TOOTH EXTRACTION  05/07/2016     FAMILY HISTORY: No family history on file.   SOCIAL HISTORY: Social History   Socioeconomic History   Marital status: Married    Spouse name: Not on file   Number of children: Not on file   Years of education: Not on file   Highest education level: Not on file  Occupational History   Occupation: [employer]-works from home  Tobacco Use   Smoking status:  Never   Smokeless tobacco: Never  Substance and Sexual Activity   Alcohol use: Never   Drug use: Never   Sexual activity: Not on file  Other Topics Concern   Not on file  Social History Narrative   Lives with spouse and stepdaughters.   Caffeine use:    Starbucks frap 1-2 per week   Right handed    Social Determinants of Health   Financial Resource Strain: Not on file  Food Insecurity: Not on file  Transportation Needs: Not on file  Physical Activity: Not on file  Stress: Not on file  Social Connections: Not on file  Intimate Partner Violence: Not on file     PHYSICAL EXAM  Vitals:   10/14/22 1431  BP: 127/76  Pulse: 63  Weight: 243 lb 9.6 oz (110.5 kg)  Height: 5\' 7"  (1.702 m)    Body mass index is 38.15 kg/m.  Generalized: Well developed, in no acute distress  Cardiology: normal rate and rhythm, no murmur auscultated  Respiratory: clear to auscultation bilaterally    Neurological examination  Mentation: Alert oriented to time, place, history taking. Follows all commands speech and language fluent Cranial nerve II-XII: Pupils were equal round reactive to light. Extraocular movements were full, visual field were full on confrontational test. Facial sensation and strength were normal. Uvula tongue midline. Head turning and shoulder shrug  were normal and symmetric. Motor: The motor testing reveals 5 over 5 strength of all 4 extremities. Good symmetric motor tone is noted throughout.  Sensory: Sensory testing is intact to soft touch on all 4 extremities. No evidence of extinction is noted.  Coordination: Cerebellar testing reveals good finger-nose-finger and heel-to-shin bilaterally.  Gait and station: Gait is normal. Tandem gait is normal. Romberg is negative. No drift is seen.  Reflexes: Deep tendon reflexes are symmetric and normal bilaterally.    DIAGNOSTIC DATA (LABS, IMAGING, TESTING) -  I reviewed patient records, labs, notes, testing and imaging myself  where available.  Lab Results  Component Value Date   WBC 5.9 03/18/2022   HGB 11.4 03/18/2022   HCT 35.1 03/18/2022   MCV 81 03/18/2022   PLT 424 03/18/2022      Component Value Date/Time   NA 142 03/18/2022 0900   K 4.6 03/18/2022 0900   CL 103 03/18/2022 0900   CO2 25 03/18/2022 0900   GLUCOSE 103 (H) 03/18/2022 0900   BUN 10 03/18/2022 0900   CREATININE 0.81 03/18/2022 0900   CALCIUM 9.6 03/18/2022 0900   PROT 7.4 03/18/2022 0900   ALBUMIN 4.4 03/18/2022 0900   AST 12 03/18/2022 0900   ALT 8 03/18/2022 0900   ALKPHOS 66 03/18/2022 0900   BILITOT 0.6 03/18/2022 0900   GFRNONAA 112 06/21/2019 1509   GFRAA 130 06/21/2019 1509   No results found for: "CHOL", "HDL", "LDLCALC", "LDLDIRECT", "TRIG", "CHOLHDL" No results found for: "HGBA1C" No results found for: "VITAMINB12" No results found for: "TSH"      No data to display               No data to display           ASSESSMENT AND PLAN  42 y.o. year old female  has no past medical history on file. here with    Multiple sclerosis (HCC)  High risk medication use  Neck pain  Dysesthesia  Brain fog  Vitamin D deficiency  Tanya Fitzpatrick is doing well, today. We will continue current treatment plan. We will update labs, today. PDMP shows appropriate refills. She will continue working closely with bariatrics and PCP. I am not overly concerned about boil but have advised close follow up with PCP and consider derm referral if these become recurrent. Healthy lifestyle habits encouraged. Memory compensation strategies reviewed. She will follow up in 6 months.    No orders of the defined types were placed in this encounter.     No orders of the defined types were placed in this encounter.      Shawnie Dapper, MSN, FNP-C 10/14/2022, 2:59 PM  [facility] [address] [place], [place] [address] [phone]

## 2022-10-14 ENCOUNTER — Ambulatory Visit (INDEPENDENT_AMBULATORY_CARE_PROVIDER_SITE_OTHER): Payer: No Typology Code available for payment source | Admitting: Family Medicine

## 2022-10-14 ENCOUNTER — Encounter: Payer: Self-pay | Admitting: Family Medicine

## 2022-10-14 VITALS — BP 127/76 | HR 63 | Ht 67.0 in | Wt 243.6 lb

## 2022-10-14 DIAGNOSIS — G35 Multiple sclerosis: Secondary | ICD-10-CM | POA: Diagnosis not present

## 2022-10-14 DIAGNOSIS — E559 Vitamin D deficiency, unspecified: Secondary | ICD-10-CM

## 2022-10-14 DIAGNOSIS — Z79899 Other long term (current) drug therapy: Secondary | ICD-10-CM

## 2022-10-14 DIAGNOSIS — M542 Cervicalgia: Secondary | ICD-10-CM | POA: Diagnosis not present

## 2022-10-14 DIAGNOSIS — M25511 Pain in right shoulder: Secondary | ICD-10-CM

## 2022-10-14 DIAGNOSIS — G35D Multiple sclerosis, unspecified: Secondary | ICD-10-CM

## 2022-10-14 DIAGNOSIS — R208 Other disturbances of skin sensation: Secondary | ICD-10-CM | POA: Diagnosis not present

## 2022-10-14 DIAGNOSIS — R4189 Other symptoms and signs involving cognitive functions and awareness: Secondary | ICD-10-CM

## 2022-10-15 LAB — CBC WITH DIFFERENTIAL/PLATELET
Basophils Absolute: 0 10*3/uL (ref 0.0–0.2)
Basos: 0 %
EOS (ABSOLUTE): 0.1 10*3/uL (ref 0.0–0.4)
Eos: 2 %
Hematocrit: 33.4 % — ABNORMAL LOW (ref 34.0–46.6)
Hemoglobin: 10.8 g/dL — ABNORMAL LOW (ref 11.1–15.9)
Immature Grans (Abs): 0 10*3/uL (ref 0.0–0.1)
Immature Granulocytes: 0 %
Lymphocytes Absolute: 2.6 10*3/uL (ref 0.7–3.1)
Lymphs: 49 %
MCH: 26.6 pg (ref 26.6–33.0)
MCHC: 32.3 g/dL (ref 31.5–35.7)
MCV: 82 fL (ref 79–97)
Monocytes Absolute: 0.4 10*3/uL (ref 0.1–0.9)
Monocytes: 7 %
Neutrophils Absolute: 2.2 10*3/uL (ref 1.4–7.0)
Neutrophils: 42 %
Platelets: 420 10*3/uL (ref 150–450)
RBC: 4.06 x10E6/uL (ref 3.77–5.28)
RDW: 13.2 % (ref 11.7–15.4)
WBC: 5.3 10*3/uL (ref 3.4–10.8)

## 2022-10-15 LAB — COMPREHENSIVE METABOLIC PANEL
ALT: 7 IU/L (ref 0–32)
AST: 12 IU/L (ref 0–40)
Albumin/Globulin Ratio: 1.4 (ref 1.2–2.2)
Albumin: 4.3 g/dL (ref 3.9–4.9)
Alkaline Phosphatase: 76 IU/L (ref 44–121)
BUN/Creatinine Ratio: 13 (ref 9–23)
BUN: 8 mg/dL (ref 6–24)
Bilirubin Total: 0.6 mg/dL (ref 0.0–1.2)
CO2: 24 mmol/L (ref 20–29)
Calcium: 9.4 mg/dL (ref 8.7–10.2)
Chloride: 104 mmol/L (ref 96–106)
Creatinine, Ser: 0.6 mg/dL (ref 0.57–1.00)
Globulin, Total: 3.1 g/dL (ref 1.5–4.5)
Glucose: 88 mg/dL (ref 70–99)
Potassium: 4.2 mmol/L (ref 3.5–5.2)
Sodium: 141 mmol/L (ref 134–144)
Total Protein: 7.4 g/dL (ref 6.0–8.5)
eGFR: 116 mL/min/{1.73_m2} (ref >59)

## 2022-10-15 LAB — VITAMIN D 25 HYDROXY (VIT D DEFICIENCY, FRACTURES): Vit D, 25-Hydroxy: 29.9 ng/mL — ABNORMAL LOW (ref 30.0–100.0)

## 2022-11-11 ENCOUNTER — Other Ambulatory Visit: Payer: Self-pay | Admitting: Neurology

## 2022-11-12 ENCOUNTER — Other Ambulatory Visit: Payer: Self-pay

## 2022-12-09 ENCOUNTER — Other Ambulatory Visit: Payer: Self-pay | Admitting: Neurology

## 2022-12-10 NOTE — Telephone Encounter (Signed)
Last seen on 10/14/22 Follow up scheduled on 04/27/23  Ambien last filled on 09/27/22 #90 tablets (90 day supply) Rx pending to be signed

## 2023-03-29 ENCOUNTER — Other Ambulatory Visit: Payer: Self-pay | Admitting: Neurology

## 2023-03-29 NOTE — Telephone Encounter (Signed)
Last seen on 10/14/22 Follow up scheduled on 04/27/23 Last filled on 12/11/22 #60 tablets (30 day supply) Rx pending to be signed

## 2023-04-06 ENCOUNTER — Other Ambulatory Visit: Payer: Self-pay | Admitting: Neurology

## 2023-04-07 NOTE — Telephone Encounter (Signed)
Last seen on 10/14/22 Follow up scheduled 04/27/23

## 2023-04-27 ENCOUNTER — Ambulatory Visit (INDEPENDENT_AMBULATORY_CARE_PROVIDER_SITE_OTHER): Payer: No Typology Code available for payment source | Admitting: Neurology

## 2023-04-27 ENCOUNTER — Encounter: Payer: Self-pay | Admitting: Neurology

## 2023-04-27 VITALS — BP 115/82 | HR 65 | Ht 67.0 in | Wt 251.0 lb

## 2023-04-27 DIAGNOSIS — M542 Cervicalgia: Secondary | ICD-10-CM

## 2023-04-27 DIAGNOSIS — R208 Other disturbances of skin sensation: Secondary | ICD-10-CM

## 2023-04-27 DIAGNOSIS — R4189 Other symptoms and signs involving cognitive functions and awareness: Secondary | ICD-10-CM | POA: Diagnosis not present

## 2023-04-27 DIAGNOSIS — G35 Multiple sclerosis: Secondary | ICD-10-CM | POA: Diagnosis not present

## 2023-04-27 DIAGNOSIS — R269 Unspecified abnormalities of gait and mobility: Secondary | ICD-10-CM

## 2023-04-27 DIAGNOSIS — Z79899 Other long term (current) drug therapy: Secondary | ICD-10-CM

## 2023-04-27 MED ORDER — CYCLOBENZAPRINE HCL 5 MG PO TABS: 5.0000 mg | ORAL_TABLET | Freq: Three times a day (TID) | ORAL | 3 refills | Status: AC | PRN

## 2023-04-27 MED ORDER — ZOLPIDEM TARTRATE 5 MG PO TABS: ORAL_TABLET | ORAL | 1 refills | Status: DC

## 2023-04-27 MED ORDER — GABAPENTIN 300 MG PO CAPS: 300.0000 mg | ORAL_CAPSULE | Freq: Two times a day (BID) | ORAL | 3 refills | Status: DC

## 2023-04-27 NOTE — Progress Notes (Signed)
[facility]  PATIENT: Tanya Fitzpatrick DOB: 1980-11-23  REFERRING DOCTOR OR PCP: Abran Duke, MD SOURCE: Patient, notes from primary care, imaging and lab reports, MRI images personally reviewed.  _________________________________   HISTORICAL  CHIEF COMPLAINT:  Chief Complaint  Patient presents with   Follow-up    Pt in room 10 alone.Here for MS follow up. Noticed right leg feels painful and numb at times, noticed more when turning. Not able to lay on right side when in bed, feel uncomfortable noticed in last 3-4 months.     HISTORY OF PRESENT ILLNESS:  Tanya Fitzpatrick is a 42 year old woman with relapsing remitting multiple sclerosis.  Update 04/26/2022: She is on Bafiertam (MMF) as a DMT for her MS.   She denies exacerbation.     Gait is doing well.   She sometimes holds the rail on the stairs.  She walks less due to colder weather.    She goes up and down stairs.    While sitting or standing, she denies sensory symptoms.   However, she notes more pain in certain conditions.  Sometimes this is in the right foot.    Putting pressure in the foot increases the symptoms.   She notes some discomfort when she lays on her right side.    She notes vision is symmetric but a little blurry - she will see ophthalmology soon. She denies color vision asymmetry.  Bladder functin is fine   MRI's looked good in 2023 with no new lesions   She also has pain in her neck and shoulder bilaterally  Pain does not go further into the arms.  The cyclobenzaprine up to tid is helping her a lot.    Sometimes extended driving or some household tasks exacerbate her pain.    For most part, pain is improved and more tolerable with her medications.    She notes LBP is better with losing 80 pounds with diet initially and then gastric sleeve procedure.      Weight is now stabilized 90 pounds below max.    Cyclobenzaprine, Gabapentin and tramadol has helped the pain.     She has mild  fatigue.  She is sometimes sleepy.  She does not snore..  She sleeps well most nights on Ambien 5 mg.  Mood has done well recently.   She feels cognition is stable (occ. Forgetful)  MS HISTORY: She was diagnosed with MS 05/2019.      In February 2020, she had numbness in the chest bilaterally and in the right arm.   Over the next month, the numbness resolved in the chest but continued in the right arm.  She reports that an MRI had been scheduled but due to COVID-19 and changes in schedules, she never did the study.     She had Covid-19 in mid November 2020 and she noted more headaches but did not require hospitalization.  She noted numbness in the left hand that started in early December.  MRI 05/24/2019 was c/w MS and she was referred.  She was started on Bafiertam (MMF) February 2021.    IMAGING MRI of the brain (without contrast) 05/24/2019 shows multiple T2/FLAIR hyperintense foci in the periventricular, juxtacortical and deep white matter.  Additionally there are some foci in the cerebellum and brainstem.  The pattern is very consistent with multiple sclerosis.  Some foci are hyperintense on diffusion-weighted images but it is uncertain if these represent more acute lesions or shine through.  MRI of the brain 05/25/2020 showed  a couple T2/FLAIR hyperintense foci in the hemispheres and left pons in a pattern and configuration consistent with chronic demyelinating plaque associated with multiple sclerosis.  None of the foci appear to be acute.  They do not enhance.  Compared to the MRI from 05/24/2019, there are no new lesions.       Normal enhancement pattern.  No acute findings  MRI of the brain 10/10/2021 shows multiple T2/FLAIR hyperintense foci in the periventricular, juxtacortical and deep white matter.  A focus is also noted in the pons.  None of the foci enhance or appear to be acute.  Compared to the MRI from 2022, there are no new lesions.  MRI cervical spine 10/10/2021 shows 2 T2 hyperintense foci, 1  adjacent to C2 posterolaterally to the right and and other centrally at C3-C4.  We do not have comparison cervical spine images.  However, it appears as though both of these were present on sagittal FLAIR images from the previous brain MRIs.  There was subtle T1 hyperintensity that could be artifact or enhancement of the C3-C4 focus.  As the focus appears to be present on MRI of the brain from 05/25/2020 and 05/24/2019, this favors artifact.  FH: She has one first cousin and one second cousin with MS.  One diagnosed in early 74's and one around age 28.     REVIEW OF SYSTEMS: Constitutional: No fevers, chills, sweats, or change in appetite.  She notes fatigue.  She has some insomnia at times. Eyes: No visual changes, double vision, eye pain Ear, nose and throat: No hearing loss, ear pain, nasal congestion, sore throat Cardiovascular: No chest pain, palpitations Respiratory:  No shortness of breath at rest or with exertion.   No wheezes GastrointestinaI: No nausea, vomiting, diarrhea, abdominal pain, fecal incontinence Genitourinary: She has urinary frequency.. Musculoskeletal: As above. Integumentary: No rash, pruritus, skin lesions Neurological: as above Psychiatric: No depression at this time.  No anxiety Endocrine: No palpitations, diaphoresis, change in appetite, change in weigh or increased thirst Hematologic/Lymphatic:  No anemia, purpura, petechiae. Allergic/Immunologic: No itchy/runny eyes, nasal congestion, recent allergic reactions, rashes  ALLERGIES: No Known Allergies  HOME MEDICATIONS:  Current Outpatient Medications:    Acetaminophen (ARTHRITIS PAIN PO), Take 2-4 tablets by mouth daily., Disp: , Rfl:    albuterol (PROVENTIL HFA;VENTOLIN HFA) 108 (90 Base) MCG/ACT inhaler, Inhale into the lungs., Disp: , Rfl:    BAFIERTAM 95 MG CPDR, TAKE 2 CAPSULES BY MOUTH TWICE  DAILY, Disp: 120 capsule, Rfl: 1   fluticasone (FLONASE) 50 MCG/ACT nasal spray, SPRAY 2 SPRAYS INTO BOTH NOSTRILS  DAILY, Disp: , Rfl:    Loratadine (CLARITIN REDITABS PO), Take by mouth., Disp: , Rfl:    traMADol (ULTRAM) 50 MG tablet, TAKE 1 TABLET BY MOUTH TWICE  DAILY AS NEEDED, Disp: 60 tablet, Rfl: 5   cyclobenzaprine (FLEXERIL) 5 MG tablet, Take 1 tablet (5 mg total) by mouth 3 (three) times daily as needed for muscle spasms., Disp: 270 tablet, Rfl: 3   gabapentin (NEURONTIN) 300 MG capsule, Take 1 capsule (300 mg total) by mouth 2 (two) times daily., Disp: 270 capsule, Rfl: 3   zolpidem (AMBIEN) 5 MG tablet, TAKE 1 TABLET BY MOUTH AT  BEDTIME AS NEEDED FOR SLEEP, Disp: 90 tablet, Rfl: 1  PAST MEDICAL HISTORY: No past medical history on file.  PAST SURGICAL HISTORY: Past Surgical History:  Procedure Laterality Date   TOOTH EXTRACTION  05/07/2016    FAMILY HISTORY: No family history on file.  SOCIAL HISTORY:  Social History   Socioeconomic History   Marital status: Married    Spouse name: Not on file   Number of children: Not on file   Years of education: Not on file   Highest education level: Not on file  Occupational History   Occupation: [employer]-works from home  Tobacco Use   Smoking status: Never   Smokeless tobacco: Never  Substance and Sexual Activity   Alcohol use: Never   Drug use: Never   Sexual activity: Not on file  Other Topics Concern   Not on file  Social History Narrative   Lives with spouse and stepdaughters.   Caffeine use:    Starbucks frap 1-2 per week   Right handed    Social Determinants of Health   Financial Resource Strain: Low Risk  (06/23/2022)   Received from [facility], [facility]   Overall Financial Resource Strain (CARDIA)    Difficulty of Paying Living Expenses: Not hard at all  Food Insecurity: No Food Insecurity (06/23/2022)   Received from [facility], [facility]   Hunger Vital Sign    Worried About Running Out of Food in the Last Year: Never true    Ran Out of Food in the Last Year: Never true  Transportation Needs:  No Transportation Needs (06/23/2022)   Received from [facility], [facility]   PRAPARE - Transportation    Lack of Transportation (Medical): No    Lack of Transportation (Non-Medical): No  Physical Activity: Unknown (06/23/2022)   Received from [facility], [facility]   Exercise Vital Sign    Days of Exercise per Week: 0 days    Minutes of Exercise per Session: Not on file  Stress: No Stress Concern Present (06/23/2022)   Received from [facility], [facility] - Occupational Stress Questionnaire    Feeling of Stress : Not at all  Social Connections: Moderately Integrated (06/23/2022)   Received from [facility], [facility]   Social Network    How would you rate your social network (family, work, friends)?: Adequate participation with social networks  Intimate Partner Violence: Not At Risk (06/23/2022)   Received from [facility], [facility]   HITS    Over the last 12 months how often did your partner physically hurt you?: Never    Over the last 12 months how often did your partner insult you or talk down to you?: Never    Over the last 12 months how often did your partner threaten you with physical harm?: Never    Over the last 12 months how often did your partner scream or curse at you?: Never     PHYSICAL EXAM  Vitals:   04/27/23 0804  BP: 115/82  Pulse: 65  Weight: 251 lb (113.9 kg)  Height: 5\' 7"  (1.702 m)    Body mass index is 39.31 kg/m.   General: The patient is well-developed and well-nourished and in no acute distress  HEENT:  Head is [place]/AT.  Sclera are anicteric.   Neurologic Exam  Mental status: The patient is alert and oriented x 3 at the time of the examination. The patient has apparent normal recent and remote memory, with an apparently normal attention span and concentration ability.   Speech is normal.  Cranial nerves: Extraocular movements are full.   Colors are symmetric.    Facial symmetry is  present. Facial strength is normal.  Trapezius and sternocleidomastoid strength is normal. No dysarthria is  noted.   No obvious hearing deficits are noted.  Motor:  Muscle bulk is normal.   Tone is normal. Strength is  Now  5/5 in both arms and legs.    Sensory: She has reduced touch/temperature in the right hand .   She has reduced vibration sensation in the right arm .   Legs now symmetric  Coordination: Cerebellar testing reveals good left but mildly reduced right finger-nose-finger and reduced left heel-to-shin .  Gait and station: Station is normal.  Gait was normal.  Tandem gait was wide... Romberg is negative.   Reflexes: Deep tendon reflexes are symmetric and normal bilaterally.         ASSESSMENT AND PLAN  Multiple sclerosis (HCC) - Plan: CBC with Differential/Platelet, MR BRAIN W WO CONTRAST  High risk medication use - Plan: CBC with Differential/Platelet  Dysesthesia  Brain fog  Neck pain  Gait disturbance   1.   Continue Bafiertam (monomethyl fumarate).  We will check CBC with differential and CMP today.  MRI brain to determine of any breakthrough activity.  Consider a dffernt agent if occurring 2.   Stay active and exercise as tolerated.. 3.   Continue gabapentin , tramadol cyclobenzaprine   Take OTC Vit D 4.   She will return to see me in 6 months or sooner if there are new or worsening neurologic symptoms.  Kimmora Risenhoover A. Epimenio Foot, MD, [facility] 04/27/2023, 8:56 AM Certified in Neurology, Clinical Neurophysiology, Sleep Medicine and Neuroimaging  [facility] [address], [address] [place], [place] [address] [phone]

## 2023-04-28 LAB — CBC WITH DIFFERENTIAL/PLATELET
Basophils Absolute: 0 10*3/uL (ref 0.0–0.2)
Basos: 0 %
EOS (ABSOLUTE): 0.1 10*3/uL (ref 0.0–0.4)
Eos: 2 %
Hematocrit: 30.6 % — ABNORMAL LOW (ref 34.0–46.6)
Hemoglobin: 9 g/dL — ABNORMAL LOW (ref 11.1–15.9)
Immature Grans (Abs): 0 10*3/uL (ref 0.0–0.1)
Immature Granulocytes: 0 %
Lymphocytes Absolute: 2.3 10*3/uL (ref 0.7–3.1)
Lymphs: 41 %
MCH: 22.3 pg — ABNORMAL LOW (ref 26.6–33.0)
MCHC: 29.4 g/dL — ABNORMAL LOW (ref 31.5–35.7)
MCV: 76 fL — ABNORMAL LOW (ref 79–97)
Monocytes Absolute: 0.5 10*3/uL (ref 0.1–0.9)
Monocytes: 8 %
Neutrophils Absolute: 2.7 10*3/uL (ref 1.4–7.0)
Neutrophils: 49 %
Platelets: 489 10*3/uL — ABNORMAL HIGH (ref 150–450)
RBC: 4.03 x10E6/uL (ref 3.77–5.28)
RDW: 14.8 % (ref 11.7–15.4)
WBC: 5.6 10*3/uL (ref 3.4–10.8)

## 2023-05-24 ENCOUNTER — Telehealth: Payer: Self-pay | Admitting: Neurology

## 2023-05-24 DIAGNOSIS — G35 Multiple sclerosis: Secondary | ICD-10-CM

## 2023-05-24 NOTE — Telephone Encounter (Signed)
 She was scheduled for MRI brain at [facility] on 1/7 and cancelled her appointment. [facility] NPR case #[id number].

## 2023-05-25 ENCOUNTER — Telehealth: Payer: Self-pay | Admitting: Neurology

## 2023-05-25 ENCOUNTER — Other Ambulatory Visit: Payer: Self-pay | Admitting: *Deleted

## 2023-05-25 ENCOUNTER — Other Ambulatory Visit: Payer: No Typology Code available for payment source

## 2023-05-25 MED ORDER — BAFIERTAM 95 MG PO CPDR: DELAYED_RELEASE_CAPSULE | ORAL | 5 refills | Status: DC

## 2023-05-25 NOTE — Telephone Encounter (Signed)
 Last seen on 04/27/23 Follow up scheduled on 11/23/23

## 2023-05-25 NOTE — Telephone Encounter (Signed)
 Form received pending to be signed by Dr.Sater.

## 2023-05-25 NOTE — Telephone Encounter (Signed)
 [facility]/ Romie Minus calling to check on medical clearance form faxed on 04/12/23. Can Fax to [phone].  Refaxing medical clearance form to [phone]

## 2023-05-25 NOTE — Telephone Encounter (Signed)
 Called [facility] and asked them to refax to POD 1 fax.

## 2023-05-25 NOTE — Telephone Encounter (Signed)
 Form signed and faxed to [phone]

## 2023-06-09 NOTE — Addendum Note (Signed)
Addended by: Arther Abbott on: 06/09/2023 02:41 PM   Modules accepted: Orders

## 2023-06-09 NOTE — Telephone Encounter (Signed)
I placed new order. Can you get patient set up at [facility]? Thank you

## 2023-06-09 NOTE — Telephone Encounter (Signed)
Pt requesting her MRI order be sent to [facility] Fax: [phone]

## 2023-06-10 NOTE — Telephone Encounter (Signed)
[employer] no auth required sent to [facility] [phone]

## 2023-08-31 ENCOUNTER — Other Ambulatory Visit: Payer: Self-pay | Admitting: Neurology

## 2023-08-31 NOTE — Telephone Encounter (Signed)
 Last seen 04/27/23 and next f/u 11/23/23. Last refilled 08/01/23 #60.

## 2023-09-22 ENCOUNTER — Encounter: Payer: Self-pay | Admitting: Family Medicine

## 2023-10-18 ENCOUNTER — Telehealth: Payer: Self-pay | Admitting: Neurology

## 2023-10-18 NOTE — Telephone Encounter (Signed)
 I reviewed the MRI of the brain from 10/15/2023 and compared it to the MRI from 2022.  Both showed multiple T2/FLAIR hyperintense foci in the periventricular, juxtacortical and deep white matter.  Additional foci in the upper pons and in the spinal cord adjacent to C2 and C3.

## 2023-10-25 ENCOUNTER — Encounter: Payer: Self-pay | Admitting: Neurology

## 2023-11-16 ENCOUNTER — Other Ambulatory Visit: Payer: Self-pay | Admitting: Neurology

## 2023-11-16 NOTE — Telephone Encounter (Signed)
 Last seen 04/27/23 Follow up scheduled on 11/23/23   Dispensed Days Supply Quantity Provider Pharmacy  ZOLPIDEM  TARTRATE  5 MG TABS 08/31/2023 90 90 tablet Sater, Charlie LABOR, MD [facility]   Rx pending to be signed

## 2023-11-19 ENCOUNTER — Other Ambulatory Visit: Payer: Self-pay | Admitting: Neurology

## 2023-11-22 NOTE — Telephone Encounter (Signed)
 Last seen on 04/27/23 Follow up scheduled 11/23/23   Dispensed Days Supply Quantity Provider Pharmacy  BAFIERTAM   95 MG CPDR 10/27/2023 30 120 capsule Sater, Charlie LABOR, MD [facility]SABRASABRA

## 2023-11-22 NOTE — Patient Instructions (Signed)
 Below is our plan:  We will continue cyclobenzaprine , tramadol  and Ambien  as prescribed. Increase gabapentin  to 900mg  daily taking 300mg  around dinner and continuing 600mg  at bedtime. Let me know if symptoms do not improve. We will update labs.   Please make sure you are staying well hydrated. I recommend 50-60 ounces daily. Well balanced diet and regular exercise encouraged. Consistent sleep schedule with 6-8 hours recommended.   Please continue follow up with care team as directed.   Follow up with Dr Vear in 6 months   You may receive a survey regarding today's visit. I encourage you to leave honest feed back as I do use this information to improve patient care. Thank you for seeing me today!

## 2023-11-22 NOTE — Progress Notes (Unsigned)
 No chief complaint on file.   HISTORY OF PRESENT ILLNESS:  11/22/23 ALL:  Tanya Fitzpatrick is a 43 y.o. female here today for follow up for RRMS. She continues Bafiertam  190mg  BID. Last MRI brain stable 09/2023. Labs have been stable.   She feels that she is doing well. No new or exacerbating symptoms. Gait stable. She continues to have waxing and waning right shoulder and arm pain. Cyclobenzaprine  5mg  as needed helps with pain and insomnia. She continues gabapentin  300mg  BID. She continues Tramadol  50mg  BID. PDMP appropriate.   She feels that she is sleeping well on Zolpidem  5mg . She tries not to take it every day. Brain fog and short term memory are significantly improved. She has tried to be more active.   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 She has lost about 80lbs. She continues vitamin d  and B12 supplements. She was recently treated for axillary boil with I&D. She has had other smaller places over the past few months.   HISTORY (copied from Dr Duncan previous note)  Tanya Fitzpatrick is a 43 year old woman with relapsing remitting multiple sclerosis.   Update 04/26/2022: She is on Bafiertam  (MMF) as a DMT for her MS.   She denies exacerbation.      Gait is doing well.   She sometimes holds the rail on the stairs.  She walks less due to colder weather.    She goes up and down stairs.    While sitting or standing, she denies sensory symptoms.   However, she notes more pain in certain conditions.  Sometimes this is in the right foot.    Putting pressure in the foot increases the symptoms.   She notes some discomfort when she lays on her right side.     She notes vision is symmetric but a little blurry - she will see ophthalmology soon. She denies color vision asymmetry.  Bladder functin is fine    MRI's looked good in 2023 with no new lesions    She also has pain in her neck and shoulder bilaterally  Pain does not go further into the arms.  The cyclobenzaprine  up to tid is  helping her a lot.    Sometimes extended driving or some household tasks exacerbate her pain.    For most part, pain is improved and more tolerable with her medications.    She notes LBP is better with losing 80 pounds with diet initially and then gastric sleeve procedure.      Weight is now stabilized 90 pounds below max.     Cyclobenzaprine , Gabapentin  and tramadol  has helped the pain.      She has mild fatigue.  She is sometimes sleepy.  She does not snore..  She sleeps well most nights on Ambien  5 mg.  Mood has done well recently.   She feels cognition is stable (occ. Forgetful)   MS HISTORY: She was diagnosed with MS 05/2019.      In February 2020, she had numbness in the chest bilaterally and in the right arm.   Over the next month, the numbness resolved in the chest but continued in the right arm.  She reports that an MRI had been scheduled but due to COVID-19 and changes in schedules, she never did the study.     She had Covid-19 in mid November 2020 and she noted more headaches but did not require hospitalization.  She noted numbness in the left hand that started in early December.  MRI 05/24/2019  was c/w MS and she was referred.  She was started on Bafiertam  (MMF) February 2021.     IMAGING MRI of the brain (without contrast) 05/24/2019 shows multiple T2/FLAIR hyperintense foci in the periventricular, juxtacortical and deep white matter.  Additionally there are some foci in the cerebellum and brainstem.  The pattern is very consistent with multiple sclerosis.  Some foci are hyperintense on diffusion-weighted images but it is uncertain if these represent more acute lesions or shine through.   MRI of the brain 05/25/2020 showed a couple T2/FLAIR hyperintense foci in the hemispheres and left pons in a pattern and configuration consistent with chronic demyelinating plaque associated with multiple sclerosis.  None of the foci appear to be acute.  They do not enhance.  Compared to the MRI from 05/24/2019,  there are no new lesions.       Normal enhancement pattern.  No acute findings   MRI of the brain 10/10/2021 shows multiple T2/FLAIR hyperintense foci in the periventricular, juxtacortical and deep white matter.  A focus is also noted in the pons.  None of the foci enhance or appear to be acute.  Compared to the MRI from 2022, there are no new lesions.   MRI cervical spine 10/10/2021 shows 2 T2 hyperintense foci, 1 adjacent to C2 posterolaterally to the right and and other centrally at C3-C4.  We do not have comparison cervical spine images.  However, it appears as though both of these were present on sagittal FLAIR images from the previous brain MRIs.  There was subtle T1 hyperintensity that could be artifact or enhancement of the C3-C4 focus.  As the focus appears to be present on MRI of the brain from 05/25/2020 and 05/24/2019, this favors artifact.   FH: She has one first cousin and one second cousin with MS.  One diagnosed in early 62's and one around age 72.    REVIEW OF SYSTEMS: Out of a complete 14 system review of symptoms, the patient complains only of the following symptoms, right arm pain, brain fog, anxiety, boils and all other reviewed systems are negative.   ALLERGIES: No Known Allergies   HOME MEDICATIONS: Outpatient Medications Prior to Visit  Medication Sig Dispense Refill   Acetaminophen (ARTHRITIS PAIN PO) Take 2-4 tablets by mouth daily.     albuterol (PROVENTIL HFA;VENTOLIN HFA) 108 (90 Base) MCG/ACT inhaler Inhale into the lungs.     BAFIERTAM  95 MG CPDR TAKE 2 CAPSULES BY MOUTH TWICE  DAILY 120 capsule 0   cyclobenzaprine  (FLEXERIL ) 5 MG tablet Take 1 tablet (5 mg total) by mouth 3 (three) times daily as needed for muscle spasms. 270 tablet 3   fluticasone (FLONASE) 50 MCG/ACT nasal spray SPRAY 2 SPRAYS INTO BOTH NOSTRILS DAILY     gabapentin  (NEURONTIN ) 300 MG capsule Take 1 capsule (300 mg total) by mouth 2 (two) times daily. 270 capsule 3   Loratadine (CLARITIN REDITABS  PO) Take by mouth.     traMADol  (ULTRAM ) 50 MG tablet TAKE 1 TABLET BY MOUTH TWICE  DAILY AS NEEDED 60 tablet 5   zolpidem  (AMBIEN ) 5 MG tablet TAKE 1 TABLET BY MOUTH AT  BEDTIME AS NEEDED FOR SLEEP 90 tablet 1   No facility-administered medications prior to visit.     PAST MEDICAL HISTORY: No past medical history on file.   PAST SURGICAL HISTORY: Past Surgical History:  Procedure Laterality Date   TOOTH EXTRACTION  05/07/2016     FAMILY HISTORY: No family history on file.   SOCIAL  HISTORY: Social History   Socioeconomic History   Marital status: Married    Spouse name: Not on file   Number of children: Not on file   Years of education: Not on file   Highest education level: Not on file  Occupational History   Occupation: [employer]-works from home  Tobacco Use   Smoking status: Never   Smokeless tobacco: Never  Substance and Sexual Activity   Alcohol use: Never   Drug use: Never   Sexual activity: Not on file  Other Topics Concern   Not on file  Social History Narrative   Lives with spouse and stepdaughters.   Caffeine use:    [employer] frap 1-2 per week   Right handed    Social Drivers of Health   Financial Resource Strain: Medium Risk (06/25/2023)   Received from [facility]   Overall Financial Resource Strain (CARDIA)    Difficulty of Paying Living Expenses: Somewhat hard  Food Insecurity: No Food Insecurity (06/25/2023)   Received from [facility]   Hunger Vital Sign    Within the past 12 months, you worried that your food would run out before you got the money to buy more.: Never true    Within the past 12 months, the food you bought just didn't last and you didn't have money to get more.: Never true  Transportation Needs: No Transportation Needs (06/25/2023)   Received from [facility] - Transportation    Lack of Transportation (Medical): No    Lack of Transportation (Non-Medical): No  Physical Activity: Insufficiently Active  (06/25/2023)   Received from [facility]   Exercise Vital Sign    On average, how many days per week do you engage in moderate to strenuous exercise (like a brisk walk)?: 2 days    On average, how many minutes do you engage in exercise at this level?: 10 min  Stress: No Stress Concern Present (06/25/2023)   Received from [facility] - Occupational Stress Questionnaire    Feeling of Stress : Only a little  Social Connections: Socially Integrated (06/25/2023)   Received from [facility]   Social Network    How would you rate your social network (family, work, friends)?: Good participation with social networks  Intimate Partner Violence: Not At Risk (06/25/2023)   Received from [facility]   HITS    Over the last 12 months how often did your partner physically hurt you?: Never    Over the last 12 months how often did your partner insult you or talk down to you?: Never    Over the last 12 months how often did your partner threaten you with physical harm?: Never    Over the last 12 months how often did your partner scream or curse at you?: Never     PHYSICAL EXAM  There were no vitals filed for this visit.   There is no height or weight on file to calculate BMI.  Generalized: Well developed, in no acute distress  Cardiology: normal rate and rhythm, no murmur auscultated  Respiratory: clear to auscultation bilaterally    Neurological examination  Mentation: Alert oriented to time, place, history taking. Follows all commands speech and language fluent Cranial nerve II-XII: Pupils were equal round reactive to light. Extraocular movements were full, visual field were full on confrontational test. Facial sensation and strength were normal. Uvula tongue midline. Head turning and shoulder shrug  were normal and symmetric.  Motor: The motor testing reveals 5 over 5 strength of all 4 extremities. Good symmetric motor tone is noted throughout.   Sensory: Sensory testing is intact to soft touch on all 4 extremities. No evidence of extinction is noted.  Coordination: Cerebellar testing reveals good finger-nose-finger and heel-to-shin bilaterally.  Gait and station: Gait is normal. Tandem gait is normal. Romberg is negative. No drift is seen.  Reflexes: Deep tendon reflexes are symmetric and normal bilaterally.    DIAGNOSTIC DATA (LABS, IMAGING, TESTING) - I reviewed patient records, labs, notes, testing and imaging myself where available.  Lab Results  Component Value Date   WBC 5.6 04/27/2023   HGB 9.0 (L) 04/27/2023   HCT 30.6 (L) 04/27/2023   MCV 76 (L) 04/27/2023   PLT 489 (H) 04/27/2023      Component Value Date/Time   NA 141 10/14/2022 1520   K 4.2 10/14/2022 1520   CL 104 10/14/2022 1520   CO2 24 10/14/2022 1520   GLUCOSE 88 10/14/2022 1520   BUN 8 10/14/2022 1520   CREATININE 0.60 10/14/2022 1520   CALCIUM 9.4 10/14/2022 1520   PROT 7.4 10/14/2022 1520   ALBUMIN 4.3 10/14/2022 1520   AST 12 10/14/2022 1520   ALT 7 10/14/2022 1520   ALKPHOS 76 10/14/2022 1520   BILITOT 0.6 10/14/2022 1520   GFRNONAA 112 06/21/2019 1509   GFRAA 130 06/21/2019 1509   No results found for: CHOL, HDL, LDLCALC, LDLDIRECT, TRIG, CHOLHDL No results found for: YHAJ8R No results found for: VITAMINB12 No results found for: TSH      No data to display               No data to display           ASSESSMENT AND PLAN  43 y.o. year old female  has no past medical history on file. here with    No diagnosis found.  Tanya Fitzpatrick is doing well, today. We will continue current treatment plan. We will update labs, today. PDMP shows appropriate refills. She will continue working closely with bariatrics and PCP. I am not overly concerned about boil but have advised close follow up with PCP and consider derm referral if these become recurrent. Healthy lifestyle habits encouraged. Memory compensation strategies  reviewed. She will follow up in 6 months.    No orders of the defined types were placed in this encounter.     No orders of the defined types were placed in this encounter.      Greig Forbes, MSN, FNP-C 11/22/2023, 1:12 PM  [facility] [address] [place], [place] [address] [phone]

## 2023-11-23 ENCOUNTER — Encounter: Payer: Self-pay | Admitting: Family Medicine

## 2023-11-23 ENCOUNTER — Ambulatory Visit (INDEPENDENT_AMBULATORY_CARE_PROVIDER_SITE_OTHER): Payer: No Typology Code available for payment source | Admitting: Family Medicine

## 2023-11-23 VITALS — BP 108/66 | HR 67 | Ht 67.0 in | Wt 251.4 lb

## 2023-11-23 DIAGNOSIS — G35 Multiple sclerosis: Secondary | ICD-10-CM

## 2023-11-23 DIAGNOSIS — R269 Unspecified abnormalities of gait and mobility: Secondary | ICD-10-CM

## 2023-11-23 DIAGNOSIS — E559 Vitamin D deficiency, unspecified: Secondary | ICD-10-CM

## 2023-11-23 DIAGNOSIS — R4189 Other symptoms and signs involving cognitive functions and awareness: Secondary | ICD-10-CM

## 2023-11-23 DIAGNOSIS — R208 Other disturbances of skin sensation: Secondary | ICD-10-CM | POA: Diagnosis not present

## 2023-11-23 DIAGNOSIS — Z79899 Other long term (current) drug therapy: Secondary | ICD-10-CM

## 2023-11-23 MED ORDER — GABAPENTIN 300 MG PO CAPS: ORAL_CAPSULE | ORAL | 3 refills | Status: AC

## 2023-11-24 ENCOUNTER — Ambulatory Visit: Payer: Self-pay | Admitting: Family Medicine

## 2023-11-24 LAB — CBC WITH DIFFERENTIAL/PLATELET
Basophils Absolute: 0 x10E3/uL (ref 0.0–0.2)
Basos: 1 %
EOS (ABSOLUTE): 0.1 x10E3/uL (ref 0.0–0.4)
Eos: 1 %
Hematocrit: 39.4 % (ref 34.0–46.6)
Hemoglobin: 12.2 g/dL (ref 11.1–15.9)
Immature Grans (Abs): 0 x10E3/uL (ref 0.0–0.1)
Immature Granulocytes: 0 %
Lymphocytes Absolute: 2.5 x10E3/uL (ref 0.7–3.1)
Lymphs: 39 %
MCH: 26.9 pg (ref 26.6–33.0)
MCHC: 31 g/dL — ABNORMAL LOW (ref 31.5–35.7)
MCV: 87 fL (ref 79–97)
Monocytes Absolute: 0.5 x10E3/uL (ref 0.1–0.9)
Monocytes: 8 %
Neutrophils Absolute: 3.3 x10E3/uL (ref 1.4–7.0)
Neutrophils: 50 %
Platelets: 394 x10E3/uL (ref 150–450)
RBC: 4.53 x10E6/uL (ref 3.77–5.28)
RDW: 14.7 % (ref 11.7–15.4)
WBC: 6.5 x10E3/uL (ref 3.4–10.8)

## 2023-11-24 LAB — VITAMIN D 25 HYDROXY (VIT D DEFICIENCY, FRACTURES): Vit D, 25-Hydroxy: 27 ng/mL — ABNORMAL LOW (ref 30.0–100.0)

## 2023-12-18 ENCOUNTER — Other Ambulatory Visit: Payer: Self-pay | Admitting: Neurology

## 2023-12-20 NOTE — Telephone Encounter (Signed)
 Last seen on 11/23/23 Follow up scheduled on 06/22/24   Dispensed Days Supply Quantity Provider Pharmacy  BAFIERTAM   95 MG CPDR 11/25/2023 30 120 capsule Sater, Charlie LABOR, MD [facility]SABRASABRA

## 2024-01-08 ENCOUNTER — Other Ambulatory Visit: Payer: Self-pay | Admitting: Neurology

## 2024-01-11 NOTE — Telephone Encounter (Signed)
 Last seen on 11/23/23 Follow up scheduled on 06/22/24   Dispensed Days Supply Quantity Provider Pharmacy  TRAMADOL  HYDROCHLORIDE  50 MG TABS 12/13/2023 30 60 tablet Sater, Charlie LABOR, MD [facility]     Rx pending to be signed

## 2024-02-09 ENCOUNTER — Other Ambulatory Visit: Payer: Self-pay | Admitting: Neurology

## 2024-02-09 NOTE — Telephone Encounter (Signed)
 Dr.Dohemier you are wok in provider this afternoon. Follow scheduled on 06/22/54   Dispensed Days Supply Quantity Provider Pharmacy  ZOLPIDEM  TARTRATE  5 MG TABS 02/09/2024 90 90 tablet Sater, Charlie LABOR, MD [facility]   Rx pending to response

## 2024-02-14 ENCOUNTER — Telehealth: Payer: Self-pay | Admitting: Neurology

## 2024-02-14 NOTE — Telephone Encounter (Signed)
 Pt called and stated that apparently her mail delivery service lost her medication and that they informed her that she would need to reach out to her provider to see if they will send in an order again for her medication so that they can resend her order. Please advise.

## 2024-02-14 NOTE — Telephone Encounter (Addendum)
 Called pt to verify medication she was needing reordered, pt states that she was needing her Tramadol  50mg .   Called pt's pharmacy [facility] [phone], and confirmed pt never received medication on 02/07/24,and told them to refill pt's script. Pharmacist stated that they will ship out pt's medication.

## 2024-02-21 ENCOUNTER — Encounter: Payer: Self-pay | Admitting: Neurology

## 2024-02-29 ENCOUNTER — Encounter: Payer: Self-pay | Admitting: Neurology

## 2024-02-29 ENCOUNTER — Ambulatory Visit: Admitting: Neurology

## 2024-02-29 VITALS — BP 114/76 | HR 67 | Ht 67.0 in | Wt 245.5 lb

## 2024-02-29 DIAGNOSIS — G35A Relapsing-remitting multiple sclerosis: Secondary | ICD-10-CM | POA: Diagnosis not present

## 2024-02-29 DIAGNOSIS — Z79899 Other long term (current) drug therapy: Secondary | ICD-10-CM | POA: Diagnosis not present

## 2024-02-29 DIAGNOSIS — R208 Other disturbances of skin sensation: Secondary | ICD-10-CM | POA: Diagnosis not present

## 2024-02-29 DIAGNOSIS — H539 Unspecified visual disturbance: Secondary | ICD-10-CM

## 2024-02-29 DIAGNOSIS — E559 Vitamin D deficiency, unspecified: Secondary | ICD-10-CM | POA: Diagnosis not present

## 2024-02-29 DIAGNOSIS — R269 Unspecified abnormalities of gait and mobility: Secondary | ICD-10-CM

## 2024-02-29 MED ORDER — TOPIRAMATE 50 MG PO TABS: 50.0000 mg | ORAL_TABLET | Freq: Every day | ORAL | 3 refills | Status: AC

## 2024-02-29 NOTE — Progress Notes (Signed)
 [facility]  PATIENT: Tanya Fitzpatrick DOB: [birth year]  REFERRING DOCTOR OR PCP: Erminio Sensing, MD SOURCE: Patient, notes from primary care, imaging and lab reports, MRI images personally reviewed.  _________________________________   HISTORICAL  CHIEF COMPLAINT:  Chief Complaint  Patient presents with   Follow-up    Pt in room 12. Alone. MS Here blurry vision, just got new glasses 5 weeks ago. Has readers that help. Pt states she is not able to read computer screen.     HISTORY OF PRESENT ILLNESS:  Tanya Fitzpatrick is a 43 year old woman with relapsing remitting multiple sclerosis.  Update 02/29/2024: She is on Bafiertam  (MMF) as a DMT for her MS.   She tolerates it well.  No GI issues.  She denies exacerbation.   I reviewed the MRI of the brain from 10/15/2023 and compared it to the MRI from 2022.  Both showed multiple T2/FLAIR hyperintense foci in the periventricular, juxtacortical and deep white matter.  Additional foci in the upper pons and in the spinal cord adjacent to C2 and C3.   No new or acute findings  Gait is doing well.   She sometimes holds the rail on the stairs.  She walks less due to colder weather.    She goes up and down stairs.    While sitting or standing, she denies sensory symptoms.  Sometimes has an electric shock in left foot while walking  She has noted worsening vision over the past few month.  Even wearing glasses, vision is blurry on the right..    She saw ophthalmology and was tod the eyes look good.   She got new glasses.  . She denies color vision asymmetry.    Of note she has much more MS plaque in left occipital lobe than right. We discussed may be affecting visual processing some to right gaze  Bladder functin is fine   She has headache, sometimes mild nausea and photophobia.  This is worse as day goes on.    She still has some neck and shoulder pain but has done better with gabapentin  and Flexeril  and tramadol    Weight loss  has helped her LBP.     She has mild fatigue.  She is sometimes sleepy.  She does not snore..  She sleeps well most nights on Ambien  5 mg.  Mood has done well recently.   She feels cognition is stable, mildly forgetful  Vit D was low and she takes supplements  MS HISTORY: She was diagnosed with MS 05/2019.      In February 2020, she had numbness in the chest bilaterally and in the right arm.   Over the next month, the numbness resolved in the chest but continued in the right arm.  She reports that an MRI had been scheduled but due to COVID-19 and changes in schedules, she never did the study.     She had Covid-19 in mid November 2020 and she noted more headaches but did not require hospitalization.  She noted numbness in the left hand that started in early December.  MRI 05/24/2019 was c/w MS and she was referred.  She was started on Bafiertam  (MMF) February 2021.    IMAGING MRI of the brain (without contrast) 05/24/2019 shows multiple T2/FLAIR hyperintense foci in the periventricular, juxtacortical and deep white matter.  Additionally there are some foci in the cerebellum and brainstem.  The pattern is very consistent with multiple sclerosis.  Some foci are hyperintense on diffusion-weighted images but it is  uncertain if these represent more acute lesions or shine through.  MRI of the brain 05/25/2020 showed a couple T2/FLAIR hyperintense foci in the hemispheres and left pons in a pattern and configuration consistent with chronic demyelinating plaque associated with multiple sclerosis.  None of the foci appear to be acute.  They do not enhance.  Compared to the MRI from 05/24/2019, there are no new lesions.       Normal enhancement pattern.  No acute findings  MRI of the brain 10/10/2021 shows multiple T2/FLAIR hyperintense foci in the periventricular, juxtacortical and deep white matter.  A focus is also noted in the pons.  None of the foci enhance or appear to be acute.  Compared to the MRI from 2022, there  are no new lesions.  MRI cervical spine 10/10/2021 shows 2 T2 hyperintense foci, 1 adjacent to C2 posterolaterally to the right and and other centrally at C3-C4.  We do not have comparison cervical spine images.  However, it appears as though both of these were present on sagittal FLAIR images from the previous brain MRIs.  There was subtle T1 hyperintensity that could be artifact or enhancement of the C3-C4 focus.  As the focus appears to be present on MRI of the brain from 05/25/2020 and 05/24/2019, this favors artifact.  FH: She has one first cousin and one second cousin with MS.  One diagnosed in early 13's and one around age 83.     REVIEW OF SYSTEMS: Constitutional: No fevers, chills, sweats, or change in appetite.  She notes fatigue.  She has some insomnia at times. Eyes: No visual changes, double vision, eye pain Ear, nose and throat: No hearing loss, ear pain, nasal congestion, sore throat Cardiovascular: No chest pain, palpitations Respiratory:  No shortness of breath at rest or with exertion.   No wheezes GastrointestinaI: No nausea, vomiting, diarrhea, abdominal pain, fecal incontinence Genitourinary: She has urinary frequency.. Musculoskeletal: As above. Integumentary: No rash, pruritus, skin lesions Neurological: as above Psychiatric: No depression at this time.  No anxiety Endocrine: No palpitations, diaphoresis, change in appetite, change in weigh or increased thirst Hematologic/Lymphatic:  No anemia, purpura, petechiae. Allergic/Immunologic: No itchy/runny eyes, nasal congestion, recent allergic reactions, rashes  ALLERGIES: No Known Allergies  HOME MEDICATIONS:  Current Outpatient Medications:    Acetaminophen (ARTHRITIS PAIN PO), Take 2-4 tablets by mouth daily., Disp: , Rfl:    albuterol (PROVENTIL HFA;VENTOLIN HFA) 108 (90 Base) MCG/ACT inhaler, Inhale into the lungs., Disp: , Rfl:    BAFIERTAM  95 MG CPDR, TAKE 2 CAPSULES BY MOUTH TWICE  DAILY, Disp: 120 capsule, Rfl:  6   cyclobenzaprine  (FLEXERIL ) 5 MG tablet, Take 1 tablet (5 mg total) by mouth 3 (three) times daily as needed for muscle spasms., Disp: 270 tablet, Rfl: 3   fluticasone (FLONASE) 50 MCG/ACT nasal spray, SPRAY 2 SPRAYS INTO BOTH NOSTRILS DAILY, Disp: , Rfl:    gabapentin  (NEURONTIN ) 300 MG capsule, Take 1 capsule (300 mg total) by mouth every evening AND 2 capsules (600 mg total) at bedtime., Disp: 270 capsule, Rfl: 3   Loratadine (CLARITIN REDITABS PO), Take by mouth., Disp: , Rfl:    topiramate (TOPAMAX) 50 MG tablet, Take 1 tablet (50 mg total) by mouth at bedtime., Disp: 90 tablet, Rfl: 3   traMADol  (ULTRAM ) 50 MG tablet, TAKE 1 TABLET BY MOUTH TWICE  DAILY AS NEEDED, Disp: 60 tablet, Rfl: 5   zolpidem  (AMBIEN ) 5 MG tablet, TAKE 1 TABLET BY MOUTH AT  BEDTIME AS NEEDED FOR SLEEP,  Disp: 90 tablet, Rfl: 0  PAST MEDICAL HISTORY: History reviewed. No pertinent past medical history.  PAST SURGICAL HISTORY: Past Surgical History:  Procedure Laterality Date   TOOTH EXTRACTION  05/07/2016    FAMILY HISTORY: History reviewed. No pertinent family history.  SOCIAL HISTORY:  Social History   Socioeconomic History   Marital status: Married    Spouse name: Not on file   Number of children: Not on file   Years of education: Not on file   Highest education level: Not on file  Occupational History   Occupation: [employer]-works from home  Tobacco Use   Smoking status: Never   Smokeless tobacco: Never  Vaping Use   Vaping status: Never Used  Substance and Sexual Activity   Alcohol use: Never   Drug use: Never   Sexual activity: Not on file  Other Topics Concern   Not on file  Social History Narrative   Lives with spouse and stepdaughters.   Caffeine use:    [employer] frap 1-2 per week   Right handed    Social Drivers of Health   Financial Resource Strain: Medium Risk (06/25/2023)   Received from [facility]   Overall Financial Resource Strain (CARDIA)    Difficulty of  Paying Living Expenses: Somewhat hard  Food Insecurity: No Food Insecurity (06/25/2023)   Received from [facility]   Hunger Vital Sign    Within the past 12 months, you worried that your food would run out before you got the money to buy more.: Never true    Within the past 12 months, the food you bought just didn't last and you didn't have money to get more.: Never true  Transportation Needs: No Transportation Needs (06/25/2023)   Received from [facility] - Transportation    Lack of Transportation (Medical): No    Lack of Transportation (Non-Medical): No  Physical Activity: Insufficiently Active (06/25/2023)   Received from [facility]   Exercise Vital Sign    On average, how many days per week do you engage in moderate to strenuous exercise (like a brisk walk)?: 2 days    On average, how many minutes do you engage in exercise at this level?: 10 min  Stress: No Stress Concern Present (06/25/2023)   Received from [facility] - Occupational Stress Questionnaire    Feeling of Stress : Only a little  Social Connections: Socially Integrated (06/25/2023)   Received from [facility]   Social Network    How would you rate your social network (family, work, friends)?: Good participation with social networks  Intimate Partner Violence: Not At Risk (06/25/2023)   Received from [facility]   HITS    Over the last 12 months how often did your partner physically hurt you?: Never    Over the last 12 months how often did your partner insult you or talk down to you?: Never    Over the last 12 months how often did your partner threaten you with physical harm?: Never    Over the last 12 months how often did your partner scream or curse at you?: Never     PHYSICAL EXAM  Vitals:   02/29/24 1407  BP: 114/76  Pulse: 67  Weight: 245 lb 8 oz (111.4 kg)  Height: 5' 7 (1.702 m)    Body mass index is 38.45 kg/m.  Vision Screening   Right  eye Left eye Both eyes  Without correction  With correction 20/30 20/30 20/30    Despite above, the right vision seems more blurry.   Color vision is symmetric   General: The patient is well-developed and well-nourished and in no acute distress  HEENT:  Head is [place]/AT.  Sclera are anicteric.   Neurologic Exam  Mental status: The patient is alert and oriented x 3 at the time of the examination. The patient has apparent normal recent and remote memory, with an apparently normal attention span and concentration ability.   Speech is normal.  Cranial nerves: Extraocular movements are full.   VF seemed symmetric.  Colors are symmetric.    Facial symmetry is present. Facial strength is normal.  Trapezius and sternocleidomastoid strength is normal. No dysarthria is noted.   No obvious hearing deficits are noted.  Motor:  Muscle bulk is normal.   Tone is normal. Strength is  Now  5/5 in both arms and legs.    Sensory: She has reduced touch/temperature in the right hand .   She has reduced vibration sensation in the right arm .   Legs now symmetric  Coordination: Cerebellar testing reveals good left but mildly reduced right finger-nose-finger and reduced left heel-to-shin .  Gait and station: Station is normal.  Gait was normal.  Tandem gait was wide... Romberg is negative.   Reflexes: Deep tendon reflexes are symmetric and normal bilaterally.         ASSESSMENT AND PLAN  Relapsing remitting multiple sclerosis  High risk medication use  Dysesthesia  Vitamin D  deficiency  Gait disturbance  Vision disturbance   1.   Continue Bafiertam  (monomethyl fumarate ).   Check CBC/D q 6 months or so 2.   Stay active and exercise as tolerated.. 3.   Continue gabapentin  , tramadol  cyclobenzaprine    Take OTC Vit D 4.   Visual changes might be related to having a fai amount of MS plaque in left occipital lobe (vs none on right).   I recommend using a larger monitor with 75 hz or higher refresh  rate She will return to see me in 6 months or sooner if there are new or worsening neurologic symptoms.  Ranita Stjulien A. Vear, MD, [facility] 02/29/2024, 2:47 PM Certified in Neurology, Clinical Neurophysiology, Sleep Medicine and Neuroimaging  [facility] [address] [place], [place] [address] [phone]

## 2024-03-22 ENCOUNTER — Other Ambulatory Visit: Payer: Self-pay | Admitting: Neurology

## 2024-03-22 ENCOUNTER — Encounter: Payer: Self-pay | Admitting: Neurology

## 2024-03-22 MED ORDER — ONDANSETRON 4 MG PO TBDP: 4.0000 mg | ORAL_TABLET | Freq: Every day | ORAL | 3 refills | Status: AC | PRN

## 2024-05-12 ENCOUNTER — Other Ambulatory Visit: Payer: Self-pay | Admitting: Neurology

## 2024-05-16 NOTE — Telephone Encounter (Signed)
 Last OV 02/29/24 Next OV 10/04/24  Last refill 01/11/24 Qty #60/5

## 2024-05-30 ENCOUNTER — Ambulatory Visit

## 2024-05-30 DIAGNOSIS — L731 Pseudofolliculitis barbae: Secondary | ICD-10-CM

## 2024-05-30 DIAGNOSIS — L720 Epidermal cyst: Secondary | ICD-10-CM

## 2024-05-30 DIAGNOSIS — L810 Postinflammatory hyperpigmentation: Secondary | ICD-10-CM | POA: Diagnosis not present

## 2024-05-30 DIAGNOSIS — L820 Inflamed seborrheic keratosis: Secondary | ICD-10-CM | POA: Diagnosis not present

## 2024-05-30 DIAGNOSIS — L680 Hirsutism: Secondary | ICD-10-CM | POA: Diagnosis not present

## 2024-05-30 DIAGNOSIS — L700 Acne vulgaris: Secondary | ICD-10-CM | POA: Diagnosis not present

## 2024-05-30 DIAGNOSIS — L689 Hypertrichosis, unspecified: Secondary | ICD-10-CM

## 2024-05-30 MED ORDER — TRETINOIN 0.025 % EX CREA: TOPICAL_CREAM | Freq: Every day | CUTANEOUS | 5 refills | Status: AC

## 2024-05-30 NOTE — Progress Notes (Signed)
 "   Subjective   Tanya Fitzpatrick is a 44 y.o. female who presents for the following: Acne/cysts. Patient is new patient  Today patient reports: Acne/cystic nodules on the neck which flares during menses, firm nodule on the L neck, post neck, upper back, L axilla. Cyst of the L axilla has been I&D.  Review of Systems:    No other skin or systemic complaints except as noted in HPI or Assessment and Plan.  The following portions of the chart were reviewed this encounter and updated as appropriate: medications, allergies, medical history  Relevant Medical History:  MS  Objective  (SKPE) Well appearing patient in no apparent distress; mood and affect are within normal limits. Examination was performed of the: Focused Exam of: the face, neck, back   Examination notable for: Seborrheic Keratosis(es): Stuck-on appearing keratotic papule(s) on the trunk, some  irritated with redness, crusting, edema, and/or partial avulsion, Acne vulgaris: Scattered open and closed comedones on the face. Hirsutism and keloidal papules  EIC - L neck 1.0 cm, upper central back superior 0.5 cm, upper central back inferior 1.0 cm, L axilla 1.0 cm Examination limited by: n/a   R cheek Erythematous stuck-on, waxy papule or plaque  Assessment & Plan  (SKAP)   Hirsutism c/b pseudofolliculitis barbae  Acne vulgaris w/ PIH  - Chronic and persistent condition with duration or expected duration over one year. Condition is symptomatic and bothersome to patient. Patient is flaring and not currently at treatment goal.  - Discussed various treatment options with patient, as well as need for consistent use for at least 6-12 weeks for full efficacy.  - Reviewed treatment options, including side effects of topical agents, oral antibiotics, OCPs (if female), oral spironolactone (if female), and isotretinoin. Discussed that isotretinoin is the most effective  - After discussion opted to initiate:  - Tretinoin  0.025% cream qhs  as tolerated - Explained this will cause irritation, dryness, and peeling with initial use, therefore start every 3 nights and increase frequency slowly - referral to [facility] for laser hair removal   EIC - L neck 1.0 cm, upper central back superior 0.5 cm, upper central back inferior 1.0 cm, L axilla 1.0 cm - Explained to patient this most likely is consistent with an epidermal inclusion cyst, which represents trapped hair follicule and skin cells under the skin - Favor isolated EIC's - discussed if persistent / recurrent lesions in intertriginous areas would consider HS  - Given that the lesion is symptomatic, patient would like to proceed with excision. They will be scheduled for surgical removal.    Procedures, orders, diagnosis for this visit:  INFLAMED SEBORRHEIC KERATOSIS R cheek Symptomatic, irritating, patient would like treated.  - Destruction of lesion - R cheek Complexity: simple   Destruction method: cryotherapy   Informed consent: discussed and consent obtained   Timeout:  patient name, date of birth, surgical site, and procedure verified Lesion destroyed using liquid nitrogen: Yes   Region frozen until ice ball extended beyond lesion: Yes   Cryo cycles: 1 or 2. Outcome: patient tolerated procedure well with no complications   Post-procedure details: wound care instructions given    HYPERTRICHOSIS   PSEUDOFOLLICULITIS BARBAE   This Visit - Ambulatory referral to Dermatology  Pseudofolliculitis barbae -     Ambulatory referral to Dermatology  Inflamed seborrheic keratosis -     Destruction of lesion  Hypertrichosis  Other orders -     Tretinoin ; Apply topically at bedtime. Apply 2-3 times weekly at night  to dry skin after cleaning. Increase frequency up to nightly as tolerated.  Dispense: 20 g; Refill: 5   Level of service outlined above   Return to clinic: Return for cyst excision/surgery.  Tanya Fitzpatrick, CMA, am acting as scribe for Tanya JAYSON Kanaris, MD  .   Documentation: I have reviewed the above documentation for accuracy and completeness, and I agree with the above.  Tanya JAYSON Kanaris, MD  "

## 2024-05-30 NOTE — Patient Instructions (Signed)
 Pre-Operative Instructions You are scheduled for a surgical procedure at [facility]. We recommend you read the following instructions. If you have any questions or concerns, please call the office at [phone].  Shower and wash the entire body with soap and water the day of your surgery paying special attention to cleansing at and around the planned surgery site.  Please continue to take your anticoagulants (blood thinners) as you normally     would before and after surgery if they were prescribed by a medical provider. Stopping them could be harmful to you. We have multiple tools in dermatology to stop the bleeding even if you take an anticoagulant. If you take over the counter blood thinner such as aspirin, Ibuprofen (Motrin, Advil and Nuprin), Naprosyn , Voltaren , Relafen, etc. that was not prescribed or recommended by a medical provider, we recommend that you stop taking it for a week before your surgery and wait to restart until 2 days after your surgery.  Please inform us  of all medications you are currently taking. All medications that are taken regularly should be taken the day of surgery as you always do. Nevertheless, we need to be informed of what medications you are taking prior to surgery to know whether they will affect the procedure or cause any complications.   Please inform us  of any medication allergies. Also inform us  of whether you have allergies to Latex or rubber products or whether you have had any adverse reaction to Lidocaine  or Epinephrine .  Please inform us  of any prosthetic or artificial body parts such as artificial heart valve, joint replacements, etc., or similar condition that might require preoperative antibiotics.   We recommend avoidance of alcohol at least two weeks prior to surgery and continued avoidance for at least two weeks after surgery.   We recommend discontinuation of tobacco smoking at least two weeks prior to surgery and continued  abstinence for at least two weeks after surgery.  Do not plan strenuous exercise, strenuous work or strenuous lifting for approximately four weeks after your surgery.   We request if you are unable to make your scheduled surgical appointment, please call us  at least a week in advance or as soon as you are aware of a problem so that we can cancel or reschedule the appointment.   You MAY TAKE TYLENOL  (acetaminophen ) for pain as it is not a blood thinner.   PLEASE PLAN TO BE IN TOWN FOR TWO WEEKS FOLLOWING SURGERY, THIS IS IMPORTANT SO YOU CAN BE CHECKED FOR DRESSING CHANGES, FUTURE REMOVAL AND TO MONITOR FOR POSSIBLE COMPLICATIONS.   Due to recent changes in healthcare laws, you may see results of your pathology and/or laboratory studies on MyChart before the doctors have had a chance to review them. We understand that in some cases there may be results that are confusing or concerning to you. Please understand that not all results are received at the same time and often the doctors may need to interpret multiple results in order to provide you with the best plan of care or course of treatment. Therefore, we ask that you please give us  2 business days to thoroughly review all your results before contacting the office for clarification. Should we see a critical lab result, you will be contacted sooner.   If You Need Anything After Your Visit  If you have any questions or concerns for your doctor, please call our main line at [phone] and press option 4 to reach your doctor's medical assistant. If  no one answers, please leave a voicemail as directed and we will return your call as soon as possible. Messages left after 4 pm will be answered the following business day.   You may also send us  a message via MyChart. We typically respond to MyChart messages within 1-2 business days.  For prescription refills, please ask your pharmacy to contact our office. Our fax number is [phone].  If you have  an urgent issue when the clinic is closed that cannot wait until the next business day, you can page your doctor at the number below.    Please note that while we do our best to be available for urgent issues outside of office hours, we are not available 24/7.   If you have an urgent issue and are unable to reach us , you may choose to seek medical care at your doctor's office, retail clinic, urgent care center, or emergency room.  If you have a medical emergency, please immediately call 911 or go to the emergency department.  Pager Numbers  - Dr. Hester: [phone]  - Dr. Jackquline: [phone]  - Dr. Claudene: [phone]   - Dr. Raymund: [phone]  In the event of inclement weather, please call our main line at [phone] for an update on the status of any delays or closures.  Dermatology Medication Tips: Please keep the boxes that topical medications come in in order to help keep track of the instructions about where and how to use these. Pharmacies typically print the medication instructions only on the boxes and not directly on the medication tubes.   If your medication is too expensive, please contact our office at [phone] option 4 or send us  a message through MyChart.   We are unable to tell what your co-pay for medications will be in advance as this is different depending on your insurance coverage. However, we may be able to find a substitute medication at lower cost or fill out paperwork to get insurance to cover a needed medication.   If a prior authorization is required to get your medication covered by your insurance company, please allow us  1-2 business days to complete this process.  Drug prices often vary depending on where the prescription is filled and some pharmacies may offer cheaper prices.  The website www.goodrx.com contains coupons for medications through different pharmacies. The prices here do not account for what the cost may be with help from  insurance (it may be cheaper with your insurance), but the website can give you the price if you did not use any insurance.  - You can print the associated coupon and take it with your prescription to the pharmacy.  - You may also stop by our office during regular business hours and pick up a GoodRx coupon card.  - If you need your prescription sent electronically to a different pharmacy, notify our office through [facility] or by phone at [phone] option 4.     Si Usted Necesita Algo Despus de Su Visita  Tambin puede enviarnos un mensaje a travs de Clinical cytogeneticist. Por lo general respondemos a los mensajes de MyChart en el transcurso de 1 a 2 das hbiles.  Para renovar recetas, por favor pida a su farmacia que se ponga en contacto con nuestra oficina. Randi lakes de fax es [address] [phone].  Si tiene un asunto urgente cuando la clnica est cerrada y que no puede esperar hasta el siguiente da hbil, puede llamar/localizar a su doctor(a) al nmero que aparece a continuacin.  Por favor, tenga en cuenta que aunque hacemos todo lo posible para estar disponibles para asuntos urgentes fuera del horario de [place], no estamos disponibles las 24 horas del da, los 7 [address]  [address] de la [place].   Si tiene un problema urgente y no puede comunicarse con nosotros, puede optar por buscar atencin mdica  en el consultorio de su doctor(a), en una clnica privada, en un centro de atencin urgente o en una sala de emergencias.  Si tiene Engineer, drilling, por favor llame inmediatamente al 911 o vaya a la sala de emergencias.  Nmeros de bper  - Dr. Hester: [phone]  - Dra. Jackquline: [phone]  - Dr. Claudene: [phone]  - Dra. Kitts: [phone]  En caso de inclemencias del [place], por favor llame a nuestra lnea principal al [phone] para una actualizacin sobre el estado de cualquier retraso o cierre.  Consejos para la medicacin en dermatologa: Por favor, guarde las  cajas en las que vienen los medicamentos de uso tpico para ayudarle a seguir las instrucciones sobre dnde y cmo usarlos. Las farmacias generalmente imprimen las instrucciones del medicamento slo en las cajas y no directamente en los tubos del [place].   Si su medicamento es muy caro, por favor, pngase en contacto con landry rieger llamando al [phone] y presione la opcin 4 o envenos un mensaje a travs de Clinical cytogeneticist.   No podemos decirle cul ser su copago por los medicamentos por adelantado ya que esto es diferente dependiendo de la cobertura de su seguro. Sin embargo, es posible que podamos encontrar un medicamento sustituto a Audiological scientist un formulario para que el seguro cubra el medicamento que se considera necesario.   Si se requiere una autorizacin previa para que su compaa de seguros malta su medicamento, por favor permtanos de 1 a 2 das hbiles para completar este proceso.  Los precios de los medicamentos varan con frecuencia dependiendo del Environmental consultant de dnde se surte la receta y alguna farmacias pueden ofrecer precios ms baratos.  El sitio web www.goodrx.com tiene cupones para medicamentos de Health and safety inspector. Los precios aqu no tienen en cuenta lo que podra costar con la ayuda del seguro (puede ser ms barato con su seguro), pero el sitio web puede darle el precio si no utiliz Tourist information centre manager.  - Puede imprimir el cupn correspondiente y llevarlo con su receta a la farmacia.  - Tambin puede pasar por nuestra oficina durante el horario de atencin regular y Education officer, museum una tarjeta de cupones de GoodRx.  - Si necesita que su receta se enve electrnicamente a una farmacia diferente, informe a nuestra oficina a travs de MyChart de [place] o por telfono llamando al [phone] y presione la opcin 4.

## 2024-06-22 ENCOUNTER — Telehealth: Payer: Self-pay

## 2024-06-22 ENCOUNTER — Ambulatory Visit: Admitting: Neurology

## 2024-06-22 NOTE — Telephone Encounter (Signed)
 [facility] have be unsuccessful reaching patient and have closed their referral. She does have a follow up scheduled next week.

## 2024-06-27 ENCOUNTER — Encounter

## 2024-10-04 ENCOUNTER — Ambulatory Visit: Admitting: Neurology
